# Patient Record
Sex: Female | Born: 1990 | Hispanic: No | Marital: Married | Smoking: Never smoker
Health system: Southern US, Community
[De-identification: ages and names within clinical notes are randomized; demographics above are authoritative.]

## PROBLEM LIST (undated history)

## (undated) DIAGNOSIS — E669 Obesity, unspecified: Secondary | ICD-10-CM

## (undated) DIAGNOSIS — Q513 Bicornate uterus: Secondary | ICD-10-CM

## (undated) DIAGNOSIS — F32A Depression, unspecified: Secondary | ICD-10-CM

## (undated) HISTORY — DX: Obesity, unspecified: E66.9

## (undated) HISTORY — PX: GALLBLADDER SURGERY: SHX652

## (undated) HISTORY — DX: Bicornate uterus: Q51.3

## (undated) HISTORY — DX: Morbid (severe) obesity due to excess calories: E66.01

## (undated) HISTORY — DX: Depression, unspecified: F32.A

---

## 2013-07-24 ENCOUNTER — Other Ambulatory Visit (HOSPITAL_COMMUNITY): Payer: Self-pay

## 2013-07-24 ENCOUNTER — Inpatient Hospital Stay (HOSPITAL_COMMUNITY): Admission: RE | Admit: 2013-07-24 | Payer: Self-pay | Source: Ambulatory Visit

## 2013-07-24 ENCOUNTER — Other Ambulatory Visit (HOSPITAL_COMMUNITY): Payer: Self-pay | Admitting: Obstetrics and Gynecology

## 2013-07-24 DIAGNOSIS — O3400 Maternal care for unspecified congenital malformation of uterus, unspecified trimester: Secondary | ICD-10-CM

## 2017-02-27 DIAGNOSIS — L2084 Intrinsic (allergic) eczema: Secondary | ICD-10-CM | POA: Diagnosis not present

## 2017-03-04 ENCOUNTER — Encounter: Payer: Self-pay | Admitting: Physician Assistant

## 2017-03-04 ENCOUNTER — Ambulatory Visit (INDEPENDENT_AMBULATORY_CARE_PROVIDER_SITE_OTHER): Payer: BLUE CROSS/BLUE SHIELD | Admitting: Physician Assistant

## 2017-03-04 VITALS — BP 107/69 | HR 72 | Ht 61.0 in | Wt 208.0 lb

## 2017-03-04 DIAGNOSIS — E6609 Other obesity due to excess calories: Secondary | ICD-10-CM

## 2017-03-04 DIAGNOSIS — Z6839 Body mass index (BMI) 39.0-39.9, adult: Secondary | ICD-10-CM

## 2017-03-04 DIAGNOSIS — Z7689 Persons encountering health services in other specified circumstances: Secondary | ICD-10-CM

## 2017-03-04 LAB — CBC
HCT: 42.1 % (ref 35.0–45.0)
Hemoglobin: 13.8 g/dL (ref 11.7–15.5)
MCH: 29 pg (ref 27.0–33.0)
MCHC: 32.8 g/dL (ref 32.0–36.0)
MCV: 88.4 fL (ref 80.0–100.0)
MPV: 9.7 fL (ref 7.5–12.5)
Platelets: 197 10*3/uL (ref 140–400)
RBC: 4.76 MIL/uL (ref 3.80–5.10)
RDW: 14.3 % (ref 11.0–15.0)
WBC: 9.5 10*3/uL (ref 3.8–10.8)

## 2017-03-04 NOTE — Patient Instructions (Addendum)
For weight: - It sounds like you are making healthy choices. Keep up the good work! - Try to limit calories to 1700 per day, including weekends. Download an app such as MyFitness Pal  - Try to add at least 3 sessions of physical activity per week. Consider getting a fitness tracker like a FitBit to monitor your steps and track your progress - Avoid alcohol. Women should limit alcohol to 1 drink per day, but if you want to lose weight, cutting out alcohol altogether is a great way to save calories - I have placed a referral for you to meet with a medical nutritionist - When you are no longer breastfeeding, the following may be medical options to consider  Qsymia  Contrave   Belviq  Saxenda  Go downstairs for labs. We will contact you within 48 hours with your lab results  Physical Activity Recommendations for modifying lipids and lowering blood pressure Engage in aerobic physical activity to reduce LDL-cholesterol, non-HDL-cholesterol, and blood pressure  Frequency: 3-4 sessions per week  Intensity: moderate to vigorous  Duration: 40 minutes on average  Physical Activity Recommendations for secondary prevention 1. Aerobic exercise  Frequency: 3-5 sessions per week  Intensity: 50-80% capacity  Duration: 20 - 60 minutes  Examples: walking, treadmill, cycling, rowing, stair climbing, and arm/leg ergometry  2. Resistance exercise  Frequency: 2-3 sessions per week  Intensity: 10-15 repetitions/set to moderate fatigue  Duration: 1-3 sets of 8-10 upper and lower body exercises  Examples: calisthenics, elastic bands, cuff/hand weights, dumbbels, free weights, wall pulleys, and weight machines  Heart-Healthy Lifestyle  Eating a diet rich in vegetables, fruits and whole grains: also includes low-fat dairy products, poultry, fish, legumes, and nuts; limit intake of sweets, sugar-sweetened beverages and red meats  Getting regular exercise  Maintaining a healthy weight  Not  smoking or getting help quitting  Staying on top of your health; for some people, lifestyle changes alone may not be enough to prevent a heart attack or stroke. In these cases, taking a statin at the right dose will most likely be necessary

## 2017-03-04 NOTE — Progress Notes (Signed)
HPI:                                                                Allison Oconnell is a 26 y.o. female who presents to [facility]: Primary Care Sports Medicine today to establish care   Current Concerns include thyroid and weight  Patient is requesting that her thyroid is checked today. She states she is having difficulty losing weight and is wondering if this is the cause. She also reports family history of thyroid disease in her mother and maternal grandmother. Patient is currently 4 months postpartum and breastfeeding. She reports a healthy pregnancy. She states she gained approximately 30 pounds in her most recent pregnancy. 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 She reports she actively diets during the weekdays, but not during the weekend. She is interested in weight loss medication.  Health Maintenance Health Maintenance  Topic Date Due  . HIV Screening  08/24/2005  . TETANUS/TDAP  08/24/2009  . PAP SMEAR  08/25/2011  . INFLUENZA VACCINE  05/15/2017    GYN/Sexual Health  Menstrual status: breastfeeding  Last pap smear: 03/30/16, NILM HPV negative  History of abnormal pap smears: no  Sexually active: yes, 1 female partner  Current contraception: mini-pill  Past Medical History:  Diagnosis Date  . Obesity    No past surgical history on file. Social History  Substance Use Topics  . Smoking status: Never Smoker  . Smokeless tobacco: Never Used  . Alcohol use Not on file   family history includes Melanoma in her maternal grandmother.  ROS: negative except as noted in the HPI  Medications: Current Outpatient Prescriptions  Medication Sig Dispense Refill  . norethindrone (MICRONOR,CAMILA,ERRIN) 0.35 MG tablet Take by mouth.     No current facility-administered medications for this visit.    Allergies  Allergen Reactions  . Sulfa Antibiotics Rash       Objective:  BP 107/69    Pulse 72   Ht 5\' 1"  (1.549 m)   Wt 208 lb (94.3 kg)   BMI 39.30 kg/m  Gen: well-groomed, cooperative, not ill-appearing, no distress HEENT: normal conjunctiva, TM's clear, oropharynx clear, moist mucus membranes, no thyromegaly or tenderness, neck supple, trachea midline Pulm: Normal work of breathing, normal phonation, clear to auscultation bilaterally CV: Normal rate, regular rhythm, s1 and s2 distinct, no murmurs, clicks or rubs, no carotid bruit GI: abdomen soft, nondistended, nontender, no masses Neuro: alert and oriented x 3, EOM's intact, PERRLA, DTR's intact, normal tone, no tremor MSK: moving all extremities, normal gait and station, no peripheral edema Skin: warm and dry, erythematous papular rash on bilaterals upper arms  Psych: normal affect, euthymic mood, normal speech and thought content   No results found for this or any previous visit (from the past 72 hour(s)). No results found.  Depression screen PHQ 2/9 03/04/2017  Decreased Interest 0  Down, Depressed, Hopeless 0  PHQ - 2 Score 0     Assessment and Plan: 26 y.o. female with   1. Encounter to establish care - declines Tdap  2. Class 2 obesity due to excess calories without serious comorbidity with body mass index (BMI) of 39.0 to 39.9 in adult -  CBC - Comprehensive metabolic panel - Lipid Panel w/reflex Direct LDL - VITAMIN D 25 Hydroxy (Vit-D Deficiency, Fractures) - TSH - Ambulatory referral to Family Practice to Dr. Dalbert GarnetBeasley for nutrition counseling - counseled patient that weight loss medications are not safe in breastfeeding, but to return when she is no longer breastfeeding to discuss options. She may be a good candidate for Qsymia or Belviq - limit calories to 1700/day - increase physical activity to 3 sessions per week - limit alcohol to 1 drink per day  Patient education and anticipatory guidance given Patient agrees with treatment plan Follow-up in 3 months or sooner as needed  Levonne Hubertharley E.  Waylyn Tenbrink PA-C

## 2017-03-05 LAB — COMPREHENSIVE METABOLIC PANEL
ALBUMIN: 4.6 g/dL (ref 3.6–5.1)
ALT: 22 U/L (ref 6–29)
AST: 21 U/L (ref 10–30)
Alkaline Phosphatase: 94 U/L (ref 33–115)
BUN: 12 mg/dL (ref 7–25)
CHLORIDE: 103 mmol/L (ref 98–110)
CO2: 26 mmol/L (ref 20–31)
Calcium: 9.6 mg/dL (ref 8.6–10.2)
Creat: 0.71 mg/dL (ref 0.50–1.10)
Glucose, Bld: 78 mg/dL (ref 65–99)
Potassium: 4.2 mmol/L (ref 3.5–5.3)
Sodium: 139 mmol/L (ref 135–146)
TOTAL PROTEIN: 7.3 g/dL (ref 6.1–8.1)
Total Bilirubin: 0.4 mg/dL (ref 0.2–1.2)

## 2017-03-05 LAB — LIPID PANEL W/REFLEX DIRECT LDL
CHOL/HDL RATIO: 3.9 ratio (ref <5.0)
CHOLESTEROL: 175 mg/dL (ref <200)
HDL: 45 mg/dL — ABNORMAL LOW (ref >50)
LDL-Cholesterol: 109 mg/dL — ABNORMAL HIGH
NON-HDL CHOLESTEROL (CALC): 130 mg/dL — AB (ref <130)
TRIGLYCERIDES: 106 mg/dL (ref <150)

## 2017-03-05 LAB — TSH: TSH: 1.55 mIU/L

## 2017-03-05 LAB — VITAMIN D 25 HYDROXY (VIT D DEFICIENCY, FRACTURES): Vit D, 25-Hydroxy: 33 ng/mL (ref 30–100)

## 2017-05-29 ENCOUNTER — Emergency Department (INDEPENDENT_AMBULATORY_CARE_PROVIDER_SITE_OTHER)
Admission: EM | Admit: 2017-05-29 | Discharge: 2017-05-29 | Disposition: A | Discharge status: Home / Self Care | Payer: BLUE CROSS/BLUE SHIELD | Source: Home / Self Care | Attending: Family Medicine | Admitting: Family Medicine

## 2017-05-29 ENCOUNTER — Encounter: Payer: Self-pay | Admitting: *Deleted

## 2017-05-29 ENCOUNTER — Emergency Department (INDEPENDENT_AMBULATORY_CARE_PROVIDER_SITE_OTHER): Payer: BLUE CROSS/BLUE SHIELD

## 2017-05-29 DIAGNOSIS — M79641 Pain in right hand: Secondary | ICD-10-CM

## 2017-05-29 DIAGNOSIS — M778 Other enthesopathies, not elsewhere classified: Secondary | ICD-10-CM | POA: Diagnosis not present

## 2017-05-29 DIAGNOSIS — M25531 Pain in right wrist: Secondary | ICD-10-CM

## 2017-05-29 DIAGNOSIS — M779 Enthesopathy, unspecified: Principal | ICD-10-CM

## 2017-05-29 MED ORDER — KETOROLAC TROMETHAMINE 60 MG/2ML IM SOLN
60.0000 mg | Freq: Once | INTRAMUSCULAR | Status: AC
  Administered 2017-05-29: 60 mg via INTRAMUSCULAR

## 2017-05-29 NOTE — Discharge Instructions (Signed)
°  You may take 500mg  acetaminophen (Tylenol) every 4-6 hours as needed for pain.  You may take up to 1000mg  (1g) of acetaminophen for the first dose.

## 2017-05-29 NOTE — ED Triage Notes (Signed)
Patient c/o right wrist pain x 1 month without known injury. Today she bumped her wrist on a lock in a cabinet. Pain shot up her arm. No w she has increased pain and weakness.

## 2017-05-29 NOTE — ED Provider Notes (Signed)
[facility]    CSN: [id number] Arrival date & time: 05/29/17  1612     History   Chief Complaint Chief Complaint  Patient presents with  . Wrist Pain    HPI Allison Oconnell is a 26 y.o. female.   HPI  Allison Oconnell is a 26 y.o. female presenting to UC with c/o 1 month of gradually worsening Right wrist and hand pain that has been aching and sore, worse with certain movements and grasping motions but pain had been tolerable.  Today, she accidentally bumped it on a lock in a cabinet causing sever pain to radiate up and down the radial aspect of her Right and hand.  She is unable to grip anything and has limited movement of hand and wrist due to pain.  Pain is aching and sharp, 6/10.  She did apply a warm and cool compress today, alternated it but has not been taking any tylenol or motrin. Pt states she is breastfeeding her 29mo old who is also eating some baby food but does not like taking medications.    Past Medical History:  Diagnosis Date  . Obesity     Patient Active Problem List   Diagnosis Date Noted  . Class 2 obesity due to excess calories without serious comorbidity with body mass index (BMI) of 39.0 to 39.9 in adult 03/04/2017    Past Surgical History:  Procedure Laterality Date  . GALLBLADDER SURGERY     may 2009    OB History    No data available       Home Medications    Prior to Admission medications   Medication Sig Start Date End Date Taking? Authorizing Provider  IRON PO Take by mouth.    [provider]  norethindrone (MICRONOR,CAMILA,ERRIN) 0.35 MG tablet Take by mouth. 12/31/16   [provider]    Family History Family History  Problem Relation Age of Onset  . Melanoma Maternal Grandmother   . Depression Mother   . Hypertension Father   . Stroke Father   . Lung cancer Paternal Grandfather     Social History Social History  Substance Use Topics  . Smoking status: Never Smoker  . Smokeless tobacco: Never Used   . Alcohol use Not on file     Allergies   Sulfa antibiotics   Review of Systems Review of Systems  Musculoskeletal: Positive for arthralgias and myalgias. Negative for joint swelling.  Skin: Positive for wound. Negative for color change.  Neurological: Positive for weakness. Negative for numbness.     Physical Exam Triage Vital Signs ED Triage Vitals  Enc Vitals Group     BP 05/29/17 1634 119/80     Pulse Rate 05/29/17 1634 (!) 104     Resp --      Temp --      Temp src --      SpO2 05/29/17 1634 99 %     Weight 05/29/17 1635 204 lb (92.5 kg)     Height --      Head Circumference --      Peak Flow --      Pain Score 05/29/17 1635 6     Pain Loc --      Pain Edu? --      Excl. in GC? --    No data found.   Updated Vital Signs BP 119/80 (BP Location: Left Arm)   Pulse (!) 104   Wt 204 lb (92.5 kg)   SpO2 99%  BMI 38.55 kg/m   Visual Acuity Right Eye Distance:   Left Eye Distance:   Bilateral Distance:    Right Eye Near:   Left Eye Near:    Bilateral Near:     Physical Exam  Constitutional: She is oriented to person, place, and time. She appears well-developed and well-nourished. No distress.  HENT:  Head: Normocephalic and atraumatic.  Eyes: EOM are normal.  Neck: Normal range of motion.  Cardiovascular: Normal rate.   Pulses:      Radial pulses are 2+ on the right side.  Pulmonary/Chest: Effort normal.  Musculoskeletal: She exhibits tenderness. She exhibits no edema.  Right wrist and hand: hesitant to move due to pain. Tenderness along radial aspect of wrist, 1st metacarpal and thumb. Slight decreased ROM of left hand, unable to make a full fist due to pain.  Neurological: She is alert and oriented to person, place, and time.  Skin: Skin is warm and dry. She is not diaphoretic.  Right wrist and hand: superficial abrasion to radial aspect of hand and wrist. No bleeding. No ecchymosis or edema.   Psychiatric: She has a normal mood and affect. Her  behavior is normal.  Nursing note and vitals reviewed.    UC Treatments / Results  Labs (all labs ordered are listed, but only abnormal results are displayed) Labs Reviewed - No data to display  EKG  EKG Interpretation None       Radiology Dg Wrist Complete Right  Result Date: 05/29/2017 CLINICAL DATA:  Wrist pain for 1 month, no known injury, initial encounter EXAM: RIGHT WRIST - COMPLETE 3+ VIEW COMPARISON:  None. FINDINGS: There is no evidence of fracture or dislocation. There is no evidence of arthropathy or other focal bone abnormality. Soft tissues are unremarkable. IMPRESSION: No acute abnormality noted. Electronically Signed   By: Alcide Clever M.D.   On: 05/29/2017 17:15   Dg Hand Complete Right  Result Date: 05/29/2017 CLINICAL DATA:  RIGHT hand and wrist pain for 1 month, no known injury, hit her hand this morning and pain worsened, radial hand and wrist pain EXAM: RIGHT HAND - COMPLETE 3+ VIEW COMPARISON:  None FINDINGS: Osseous mineralization normal. Joint spaces preserved. No acute fracture, dislocation, or bone destruction. IMPRESSION: No acute osseous abnormalities. Electronically Signed   By: Ulyses Southward M.D.   On: 05/29/2017 17:09    Procedures Procedures (including critical care time)  Medications Ordered in UC Medications  ketorolac (TORADOL) injection 60 mg (60 mg Intramuscular Given 05/29/17 1741)     Initial Impression / Assessment and Plan / UC Course  I have reviewed the triage vital signs and the nursing notes.  Pertinent labs & imaging results that were available during my care of the patient were reviewed by me and considered in my medical decision making (see chart for details).     Pt c/o worsening Right wrist and hand pain for 1 month, worse today after hitting it on a lock on a cabinet.   Hx and exam c/w tendonitis in Right hand w/o fracture or dislocation.  Final Clinical Impressions(s) / UC Diagnoses   Final diagnoses:  Right hand pain    Tendonitis of right hand  Acute pain of right wrist   Pt placed in thumb spica splint for comfort. Encouraged alternating acetaminophen and ibuprofen Pt agreeable with trying Toradol 60mg  IM in UC for pain. Encouraged f/u with Sports Medicine in 1-2 weeks if not improving, sooner if worsening.    New Prescriptions Discharge Medication  List as of 05/29/2017  5:21 PM       Controlled Substance Prescriptions [place] Controlled Substance Registry consulted? Not Applicable   Rolla Plate 05/29/17 1926

## 2017-11-18 ENCOUNTER — Ambulatory Visit: Payer: BLUE CROSS/BLUE SHIELD | Admitting: Physician Assistant

## 2019-02-25 DIAGNOSIS — M5137 Other intervertebral disc degeneration, lumbosacral region: Secondary | ICD-10-CM | POA: Diagnosis not present

## 2019-02-25 DIAGNOSIS — M9903 Segmental and somatic dysfunction of lumbar region: Secondary | ICD-10-CM | POA: Diagnosis not present

## 2019-02-25 DIAGNOSIS — M9902 Segmental and somatic dysfunction of thoracic region: Secondary | ICD-10-CM | POA: Diagnosis not present

## 2019-02-25 DIAGNOSIS — M546 Pain in thoracic spine: Secondary | ICD-10-CM | POA: Diagnosis not present

## 2019-02-26 DIAGNOSIS — M5137 Other intervertebral disc degeneration, lumbosacral region: Secondary | ICD-10-CM | POA: Diagnosis not present

## 2019-02-26 DIAGNOSIS — M546 Pain in thoracic spine: Secondary | ICD-10-CM | POA: Diagnosis not present

## 2019-02-26 DIAGNOSIS — M9902 Segmental and somatic dysfunction of thoracic region: Secondary | ICD-10-CM | POA: Diagnosis not present

## 2019-02-26 DIAGNOSIS — M9903 Segmental and somatic dysfunction of lumbar region: Secondary | ICD-10-CM | POA: Diagnosis not present

## 2019-03-04 DIAGNOSIS — M5137 Other intervertebral disc degeneration, lumbosacral region: Secondary | ICD-10-CM | POA: Diagnosis not present

## 2019-03-04 DIAGNOSIS — M546 Pain in thoracic spine: Secondary | ICD-10-CM | POA: Diagnosis not present

## 2019-03-04 DIAGNOSIS — M9903 Segmental and somatic dysfunction of lumbar region: Secondary | ICD-10-CM | POA: Diagnosis not present

## 2019-03-04 DIAGNOSIS — M9902 Segmental and somatic dysfunction of thoracic region: Secondary | ICD-10-CM | POA: Diagnosis not present

## 2019-03-11 DIAGNOSIS — M546 Pain in thoracic spine: Secondary | ICD-10-CM | POA: Diagnosis not present

## 2019-03-11 DIAGNOSIS — M9903 Segmental and somatic dysfunction of lumbar region: Secondary | ICD-10-CM | POA: Diagnosis not present

## 2019-03-11 DIAGNOSIS — M5137 Other intervertebral disc degeneration, lumbosacral region: Secondary | ICD-10-CM | POA: Diagnosis not present

## 2019-03-11 DIAGNOSIS — M9902 Segmental and somatic dysfunction of thoracic region: Secondary | ICD-10-CM | POA: Diagnosis not present

## 2019-03-18 DIAGNOSIS — M9902 Segmental and somatic dysfunction of thoracic region: Secondary | ICD-10-CM | POA: Diagnosis not present

## 2019-03-18 DIAGNOSIS — M5137 Other intervertebral disc degeneration, lumbosacral region: Secondary | ICD-10-CM | POA: Diagnosis not present

## 2019-03-18 DIAGNOSIS — M9903 Segmental and somatic dysfunction of lumbar region: Secondary | ICD-10-CM | POA: Diagnosis not present

## 2019-03-18 DIAGNOSIS — M546 Pain in thoracic spine: Secondary | ICD-10-CM | POA: Diagnosis not present

## 2019-03-25 DIAGNOSIS — M9902 Segmental and somatic dysfunction of thoracic region: Secondary | ICD-10-CM | POA: Diagnosis not present

## 2019-03-25 DIAGNOSIS — M546 Pain in thoracic spine: Secondary | ICD-10-CM | POA: Diagnosis not present

## 2019-03-25 DIAGNOSIS — M5137 Other intervertebral disc degeneration, lumbosacral region: Secondary | ICD-10-CM | POA: Diagnosis not present

## 2019-03-25 DIAGNOSIS — M9903 Segmental and somatic dysfunction of lumbar region: Secondary | ICD-10-CM | POA: Diagnosis not present

## 2019-04-01 DIAGNOSIS — M5137 Other intervertebral disc degeneration, lumbosacral region: Secondary | ICD-10-CM | POA: Diagnosis not present

## 2019-04-01 DIAGNOSIS — M9903 Segmental and somatic dysfunction of lumbar region: Secondary | ICD-10-CM | POA: Diagnosis not present

## 2019-04-01 DIAGNOSIS — M9902 Segmental and somatic dysfunction of thoracic region: Secondary | ICD-10-CM | POA: Diagnosis not present

## 2019-04-01 DIAGNOSIS — M546 Pain in thoracic spine: Secondary | ICD-10-CM | POA: Diagnosis not present

## 2019-04-15 DIAGNOSIS — M546 Pain in thoracic spine: Secondary | ICD-10-CM | POA: Diagnosis not present

## 2019-04-15 DIAGNOSIS — M9903 Segmental and somatic dysfunction of lumbar region: Secondary | ICD-10-CM | POA: Diagnosis not present

## 2019-04-15 DIAGNOSIS — M9902 Segmental and somatic dysfunction of thoracic region: Secondary | ICD-10-CM | POA: Diagnosis not present

## 2019-04-15 DIAGNOSIS — M5137 Other intervertebral disc degeneration, lumbosacral region: Secondary | ICD-10-CM | POA: Diagnosis not present

## 2019-04-22 DIAGNOSIS — M9903 Segmental and somatic dysfunction of lumbar region: Secondary | ICD-10-CM | POA: Diagnosis not present

## 2019-04-22 DIAGNOSIS — M9902 Segmental and somatic dysfunction of thoracic region: Secondary | ICD-10-CM | POA: Diagnosis not present

## 2019-04-22 DIAGNOSIS — M5137 Other intervertebral disc degeneration, lumbosacral region: Secondary | ICD-10-CM | POA: Diagnosis not present

## 2019-04-22 DIAGNOSIS — M546 Pain in thoracic spine: Secondary | ICD-10-CM | POA: Diagnosis not present

## 2019-04-29 DIAGNOSIS — M9903 Segmental and somatic dysfunction of lumbar region: Secondary | ICD-10-CM | POA: Diagnosis not present

## 2019-04-29 DIAGNOSIS — M9902 Segmental and somatic dysfunction of thoracic region: Secondary | ICD-10-CM | POA: Diagnosis not present

## 2019-04-29 DIAGNOSIS — M5137 Other intervertebral disc degeneration, lumbosacral region: Secondary | ICD-10-CM | POA: Diagnosis not present

## 2019-04-29 DIAGNOSIS — M546 Pain in thoracic spine: Secondary | ICD-10-CM | POA: Diagnosis not present

## 2019-05-06 DIAGNOSIS — O200 Threatened abortion: Secondary | ICD-10-CM | POA: Diagnosis not present

## 2019-05-06 DIAGNOSIS — O3680X0 Pregnancy with inconclusive fetal viability, not applicable or unspecified: Secondary | ICD-10-CM | POA: Diagnosis not present

## 2019-05-06 DIAGNOSIS — O360190 Maternal care for anti-D [Rh] antibodies, unspecified trimester, not applicable or unspecified: Secondary | ICD-10-CM | POA: Diagnosis not present

## 2019-05-06 DIAGNOSIS — Z3689 Encounter for other specified antenatal screening: Secondary | ICD-10-CM | POA: Diagnosis not present

## 2019-05-06 DIAGNOSIS — Z3A01 Less than 8 weeks gestation of pregnancy: Secondary | ICD-10-CM | POA: Diagnosis not present

## 2019-05-06 DIAGNOSIS — O209 Hemorrhage in early pregnancy, unspecified: Secondary | ICD-10-CM | POA: Diagnosis not present

## 2019-05-25 DIAGNOSIS — E669 Obesity, unspecified: Secondary | ICD-10-CM | POA: Diagnosis not present

## 2019-05-25 DIAGNOSIS — Z3689 Encounter for other specified antenatal screening: Secondary | ICD-10-CM | POA: Diagnosis not present

## 2019-05-25 DIAGNOSIS — O3680X0 Pregnancy with inconclusive fetal viability, not applicable or unspecified: Secondary | ICD-10-CM | POA: Diagnosis not present

## 2019-05-25 DIAGNOSIS — O99211 Obesity complicating pregnancy, first trimester: Secondary | ICD-10-CM | POA: Diagnosis not present

## 2019-07-23 DIAGNOSIS — Z23 Encounter for immunization: Secondary | ICD-10-CM | POA: Diagnosis not present

## 2019-09-23 DIAGNOSIS — M549 Dorsalgia, unspecified: Secondary | ICD-10-CM | POA: Diagnosis not present

## 2019-09-23 DIAGNOSIS — Z3689 Encounter for other specified antenatal screening: Secondary | ICD-10-CM | POA: Diagnosis not present

## 2019-09-23 DIAGNOSIS — Z3A26 26 weeks gestation of pregnancy: Secondary | ICD-10-CM | POA: Diagnosis not present

## 2019-09-23 DIAGNOSIS — O26892 Other specified pregnancy related conditions, second trimester: Secondary | ICD-10-CM | POA: Diagnosis not present

## 2019-10-01 DIAGNOSIS — O99213 Obesity complicating pregnancy, third trimester: Secondary | ICD-10-CM | POA: Diagnosis not present

## 2019-10-01 DIAGNOSIS — Z20828 Contact with and (suspected) exposure to other viral communicable diseases: Secondary | ICD-10-CM | POA: Diagnosis not present

## 2019-10-01 DIAGNOSIS — O43812 Placental infarction, second trimester: Secondary | ICD-10-CM | POA: Diagnosis not present

## 2019-10-01 DIAGNOSIS — O4393 Unspecified placental disorder, third trimester: Secondary | ICD-10-CM | POA: Diagnosis not present

## 2019-10-01 DIAGNOSIS — O368130 Decreased fetal movements, third trimester, not applicable or unspecified: Secondary | ICD-10-CM | POA: Diagnosis not present

## 2019-10-01 DIAGNOSIS — Z9049 Acquired absence of other specified parts of digestive tract: Secondary | ICD-10-CM | POA: Diagnosis not present

## 2019-10-01 DIAGNOSIS — Z3A27 27 weeks gestation of pregnancy: Secondary | ICD-10-CM | POA: Diagnosis not present

## 2019-10-01 DIAGNOSIS — Z3A26 26 weeks gestation of pregnancy: Secondary | ICD-10-CM | POA: Diagnosis not present

## 2019-10-01 DIAGNOSIS — O364XX0 Maternal care for intrauterine death, not applicable or unspecified: Secondary | ICD-10-CM | POA: Diagnosis not present

## 2019-10-01 DIAGNOSIS — O321XX0 Maternal care for breech presentation, not applicable or unspecified: Secondary | ICD-10-CM | POA: Diagnosis not present

## 2019-10-01 DIAGNOSIS — O368390 Maternal care for abnormalities of the fetal heart rate or rhythm, unspecified trimester, not applicable or unspecified: Secondary | ICD-10-CM | POA: Diagnosis not present

## 2019-10-05 DIAGNOSIS — Z3182 Encounter for Rh incompatibility status: Secondary | ICD-10-CM | POA: Diagnosis not present

## 2019-10-05 DIAGNOSIS — Z6791 Unspecified blood type, Rh negative: Secondary | ICD-10-CM | POA: Diagnosis not present

## 2019-11-04 DIAGNOSIS — R635 Abnormal weight gain: Secondary | ICD-10-CM | POA: Diagnosis not present

## 2019-11-20 ENCOUNTER — Other Ambulatory Visit: Payer: Self-pay

## 2019-11-20 ENCOUNTER — Encounter: Payer: Self-pay | Admitting: Family Medicine

## 2019-11-20 ENCOUNTER — Ambulatory Visit (INDEPENDENT_AMBULATORY_CARE_PROVIDER_SITE_OTHER): Payer: BC Managed Care – PPO | Admitting: Family Medicine

## 2019-11-20 DIAGNOSIS — Z6839 Body mass index (BMI) 39.0-39.9, adult: Secondary | ICD-10-CM

## 2019-11-20 DIAGNOSIS — E6609 Other obesity due to excess calories: Secondary | ICD-10-CM

## 2019-11-20 MED ORDER — PHENTERMINE HCL 37.5 MG PO CAPS: 37.5000 mg | ORAL_CAPSULE | ORAL | 1 refills | Status: DC

## 2019-11-20 NOTE — Assessment & Plan Note (Signed)
Discussed rx options for weight loss.  Discussed that without lifestyle changes she was unlikely to have long term success with any medications. I agree that she should be at a healthier weight prior to trying to get pregnant again.  She has tried phentermine briefly and tolerated well.  Will restart, discussed max duration I will prescribe this is 8-12 weeks.  F/u in 1 month.

## 2019-11-20 NOTE — Progress Notes (Signed)
Allison Oconnell - 29 y.o. female MRN [id number]  Date of birth: 1991-06-19  Subjective Chief Complaint  Patient presents with  . Weight Check    weight management. Wants something to help her loose weight    HPI [name] is a 29 y.o. female here today to discuss weight management.  She is interested in losing weight.  She was being followed by weight loss clinic through [facility] and prescribed phentermine however the cost was becoming too much as her insurance was not covering a lot of the appointments.   She was tolerating phentermine well without side effects and had lost about 4 lbs after the first week.  She is working on dietary changes and increasing exercise.  She did unfortunately have pregnancy ending with fetal demise last month.  She is hopeful to get pregnant again at towards the end of the year but would like to be a healthier weight before doing so.   ROS:  A comprehensive ROS was completed and negative except as noted per HPI  Allergies  Allergen Reactions  . Sulfa Antibiotics Rash    Past Medical History:  Diagnosis Date  . Obesity     Past Surgical History:  Procedure Laterality Date  . GALLBLADDER SURGERY     may 2009    Social History   Socioeconomic History  . Marital status: Married    Spouse name: Not on file  . Number of children: Not on file  . Years of education: Not on file  . Highest education level: Not on file  Occupational History  . Not on file  Tobacco Use  . Smoking status: Never Smoker  . Smokeless tobacco: Never Used  Substance and Sexual Activity  . Alcohol use: Not on file  . Drug use: Not on file  . Sexual activity: Yes    Birth control/protection: Pill  Other Topics Concern  . Not on file  Social History Narrative  . Not on file   Social Determinants of Health   Financial Resource Strain:   . Difficulty of Paying Living Expenses: Not on file  Food Insecurity:   . Worried About Programme researcher, broadcasting/film/video in the Last Year:  Not on file  . Ran Out of Food in the Last Year: Not on file  Transportation Needs:   . Lack of Transportation (Medical): Not on file  . Lack of Transportation (Non-Medical): Not on file  Physical Activity:   . Days of Exercise per Week: Not on file  . Minutes of Exercise per Session: Not on file  Stress:   . Feeling of Stress : Not on file  Social Connections:   . Frequency of Communication with Friends and Family: Not on file  . Frequency of Social Gatherings with Friends and Family: Not on file  . Attends Religious Services: Not on file  . Active Member of Clubs or Organizations: Not on file  . Attends Banker Meetings: Not on file  . Marital Status: Not on file    Family History  Problem Relation Age of Onset  . Melanoma Maternal Grandmother   . Depression Mother   . Hypertension Father   . Stroke Father   . Lung cancer Paternal Grandfather     Health Maintenance  Topic Date Due  . HIV Screening  08/24/2005  . TETANUS/TDAP  08/24/2009  . PAP-Cervical Cytology Screening  08/25/2011  . PAP SMEAR-Modifier  08/25/2011  . INFLUENZA VACCINE  05/16/2019    ----------------------------------------------------------------------------------------------------------------------------------------------------------------------------------------------------------------- Physical Exam  BP 120/86   Pulse 76   Wt 204 lb (92.5 kg)   BMI 38.55 kg/m   Physical Exam Constitutional:      Appearance: Normal appearance.  HENT:     Head: Normocephalic and atraumatic.  Eyes:     Pupils: Pupils are equal, round, and reactive to light.  Cardiovascular:     Rate and Rhythm: Normal rate and regular rhythm.  Pulmonary:     Effort: Pulmonary effort is normal.     Breath sounds: Normal breath sounds.  Musculoskeletal:     Cervical back: Neck supple.  Neurological:     General: No focal deficit present.     Mental Status: She is alert.  Psychiatric:        Mood and Affect:  Mood normal.        Behavior: Behavior normal.     ------------------------------------------------------------------------------------------------------------------------------------------------------------------------------------------------------------------- Assessment and Plan  Class 2 obesity due to excess calories without serious comorbidity with body mass index (BMI) of 39.0 to 39.9 in adult Discussed rx options for weight loss.  Discussed that without lifestyle changes she was unlikely to have long term success with any medications. I agree that she should be at a healthier weight prior to trying to get pregnant again.  She has tried phentermine briefly and tolerated well.  Will restart, discussed max duration I will prescribe this is 8-12 weeks.  F/u in 1 month.     This visit occurred during the SARS-CoV-2 public health emergency.  Safety protocols were in place, including screening questions prior to the visit, additional usage of staff PPE, and extensive cleaning of exam room while observing appropriate contact time as indicated for disinfecting solutions.

## 2019-11-20 NOTE — Patient Instructions (Signed)

## 2019-11-24 ENCOUNTER — Telehealth: Payer: Self-pay | Admitting: Family Medicine

## 2019-11-24 NOTE — Telephone Encounter (Signed)
Received fax for PA on Phentermine sent through cover my meds and received authorization.   Case ID: [id number] Valid: 10/25/19 - 02/22/20 - CF

## 2019-12-02 DIAGNOSIS — F419 Anxiety disorder, unspecified: Secondary | ICD-10-CM | POA: Diagnosis not present

## 2019-12-23 ENCOUNTER — Ambulatory Visit: Payer: BC Managed Care – PPO | Admitting: Family Medicine

## 2019-12-28 ENCOUNTER — Encounter: Payer: Self-pay | Admitting: Family Medicine

## 2019-12-28 ENCOUNTER — Ambulatory Visit (INDEPENDENT_AMBULATORY_CARE_PROVIDER_SITE_OTHER): Payer: BC Managed Care – PPO | Admitting: Family Medicine

## 2019-12-28 DIAGNOSIS — L239 Allergic contact dermatitis, unspecified cause: Secondary | ICD-10-CM

## 2019-12-28 DIAGNOSIS — E6609 Other obesity due to excess calories: Secondary | ICD-10-CM

## 2019-12-28 DIAGNOSIS — Z6839 Body mass index (BMI) 39.0-39.9, adult: Secondary | ICD-10-CM | POA: Diagnosis not present

## 2019-12-28 MED ORDER — TRIAMCINOLONE ACETONIDE 0.1 % EX CREA: 1.0000 "application " | TOPICAL_CREAM | Freq: Two times a day (BID) | CUTANEOUS | 0 refills | Status: DC

## 2019-12-28 NOTE — Patient Instructions (Signed)

## 2019-12-29 DIAGNOSIS — L259 Unspecified contact dermatitis, unspecified cause: Secondary | ICD-10-CM | POA: Insufficient documentation

## 2019-12-29 NOTE — Assessment & Plan Note (Signed)
Weight improved with phentermine . Weight loss to date is 8 lbs in 4 week period Will continue phentermine at current dose.  Continue to work on increasing activity and healthy food choices.  Return in about 2 months (around 02/27/2020) for Weight management.

## 2019-12-29 NOTE — Progress Notes (Signed)
Allison Oconnell - 29 y.o. female MRN [id number]  Date of birth: 29-Oct-1990  Subjective Chief Complaint  Patient presents with  . Weight Loss    HPI Allison Oconnell is a 29 y.o. female here today for follow up of weight management.  She was started in phentermine 37.5mg  at last visit 4 weeks ago.  She is tolerating this well and weight is down about 8lbs since last visit.  She has made changes to diet and is working on increasing exercise. She denies palpitations, shortness of breath, insomnia or increased anxiety at current dose.    She also has rash on bilateral arms and neck. Started about 1 week ago.  She was at a baby shower and opened a canister containing raffle tickets.  Unknown substance in container contacted skin and she noticed burning sensation at first with rash the following day.  She has tried moisturizer without much improvement.  Areas are itchy.  She denies pain or blistering.   ROS:  A comprehensive ROS was completed and negative except as noted per HPI  Allergies  Allergen Reactions  . Sulfa Antibiotics Rash    Past Medical History:  Diagnosis Date  . Obesity     Past Surgical History:  Procedure Laterality Date  . GALLBLADDER SURGERY     may 2009    Social History   Socioeconomic History  . Marital status: Married    Spouse name: Not on file  . Number of children: Not on file  . Years of education: Not on file  . Highest education level: Not on file  Occupational History  . Not on file  Tobacco Use  . Smoking status: Never Smoker  . Smokeless tobacco: Never Used  Substance and Sexual Activity  . Alcohol use: Not on file  . Drug use: Not on file  . Sexual activity: Yes    Birth control/protection: Pill  Other Topics Concern  . Not on file  Social History Narrative  . Not on file   Social Determinants of Health   Financial Resource Strain:   . Difficulty of Paying Living Expenses:   Food Insecurity:   . Worried About Charity fundraiser in the  Last Year:   . Arboriculturist in the Last Year:   Transportation Needs:   . Film/video editor (Medical):   Marland Kitchen Lack of Transportation (Non-Medical):   Physical Activity:   . Days of Exercise per Week:   . Minutes of Exercise per Session:   Stress:   . Feeling of Stress :   Social Connections:   . Frequency of Communication with Friends and Family:   . Frequency of Social Gatherings with Friends and Family:   . Attends Religious Services:   . Active Member of Clubs or Organizations:   . Attends Archivist Meetings:   Marland Kitchen Marital Status:     Family History  Problem Relation Age of Onset  . Melanoma Maternal Grandmother   . Depression Mother   . Hypertension Father   . Stroke Father   . Lung cancer Paternal Grandfather     Health Maintenance  Topic Date Due  . PAP-Cervical Cytology Screening  Never done  . PAP SMEAR-Modifier  Never done  . INFLUENZA VACCINE  01/13/2020 (Originally 05/16/2019)  . TETANUS/TDAP  12/22/2020 (Originally 08/24/2009)  . HIV Screening  12/22/2020 (Originally 08/24/2005)     ----------------------------------------------------------------------------------------------------------------------------------------------------------------------------------------------------------------- Physical Exam BP 109/74   Pulse 76   Temp (!) 97.4 F (36.3 C) (Oral)  Wt 196 lb (88.9 kg)   LMP 12/26/2019 (Exact Date)   SpO2 100% Comment: on RA  BMI 37.03 kg/m   Physical Exam Constitutional:      Appearance: Normal appearance.  HENT:     Head: Normocephalic and atraumatic.  Eyes:     General: No scleral icterus. Cardiovascular:     Rate and Rhythm: Normal rate and regular rhythm.  Pulmonary:     Effort: Pulmonary effort is normal.     Breath sounds: Normal breath sounds.  Musculoskeletal:     Cervical back: Neck supple.  Skin:    Comments: Papular rash on bilateral arms with some scaling around areas.  No blistering noted.    Neurological:     General: No focal deficit present.     Mental Status: She is alert.  Psychiatric:        Mood and Affect: Mood normal.        Behavior: Behavior normal.     ------------------------------------------------------------------------------------------------------------------------------------------------------------------------------------------------------------------- Assessment and Plan  Class 2 obesity due to excess calories without serious comorbidity with body mass index (BMI) of 39.0 to 39.9 in adult Weight improved with phentermine . Weight loss to date is 8 lbs in 4 week period Will continue phentermine at current dose.  Continue to work on increasing activity and healthy food choices.  Return in about 2 months (around 02/27/2020) for Weight management.   Contact dermatitis Rash with appearance of contact dermatitis.  Will start topical triamcinolone bid.  She will call if not improving.    Meds ordered this encounter  Medications  . triamcinolone cream (KENALOG) 0.1 %    Sig: Apply 1 application topically 2 (two) times daily.    Dispense:  30 g    Refill:  0    Return in about 2 months (around 02/27/2020) for Weight management.    This visit occurred during the SARS-CoV-2 public health emergency.  Safety protocols were in place, including screening questions prior to the visit, additional usage of staff PPE, and extensive cleaning of exam room while observing appropriate contact time as indicated for disinfecting solutions.

## 2019-12-29 NOTE — Assessment & Plan Note (Signed)
Rash with appearance of contact dermatitis.  Will start topical triamcinolone bid.  She will call if not improving.

## 2020-01-27 ENCOUNTER — Other Ambulatory Visit: Payer: Self-pay | Admitting: Family Medicine

## 2020-01-27 NOTE — Telephone Encounter (Signed)
I received a fax from the pharmacy requesting a refill of Phentermine. Per last note she is to follow up in May. Please advise.

## 2020-01-28 MED ORDER — PHENTERMINE HCL 37.5 MG PO CAPS: 37.5000 mg | ORAL_CAPSULE | ORAL | 0 refills | Status: DC

## 2020-04-29 DIAGNOSIS — O360110 Maternal care for anti-D [Rh] antibodies, first trimester, not applicable or unspecified: Secondary | ICD-10-CM | POA: Diagnosis not present

## 2020-04-29 DIAGNOSIS — Z3A00 Weeks of gestation of pregnancy not specified: Secondary | ICD-10-CM | POA: Diagnosis not present

## 2020-04-29 DIAGNOSIS — E669 Obesity, unspecified: Secondary | ICD-10-CM | POA: Diagnosis not present

## 2020-04-29 DIAGNOSIS — O99211 Obesity complicating pregnancy, first trimester: Secondary | ICD-10-CM | POA: Diagnosis not present

## 2020-04-29 DIAGNOSIS — O09291 Supervision of pregnancy with other poor reproductive or obstetric history, first trimester: Secondary | ICD-10-CM | POA: Diagnosis not present

## 2020-04-29 DIAGNOSIS — O209 Hemorrhage in early pregnancy, unspecified: Secondary | ICD-10-CM | POA: Diagnosis not present

## 2020-04-29 DIAGNOSIS — O200 Threatened abortion: Secondary | ICD-10-CM | POA: Diagnosis not present

## 2020-04-29 DIAGNOSIS — Z3201 Encounter for pregnancy test, result positive: Secondary | ICD-10-CM | POA: Diagnosis not present

## 2020-05-10 DIAGNOSIS — O200 Threatened abortion: Secondary | ICD-10-CM | POA: Diagnosis not present

## 2020-05-10 DIAGNOSIS — O09291 Supervision of pregnancy with other poor reproductive or obstetric history, first trimester: Secondary | ICD-10-CM | POA: Diagnosis not present

## 2020-05-10 DIAGNOSIS — O99211 Obesity complicating pregnancy, first trimester: Secondary | ICD-10-CM | POA: Diagnosis not present

## 2020-05-10 DIAGNOSIS — Z3A00 Weeks of gestation of pregnancy not specified: Secondary | ICD-10-CM | POA: Diagnosis not present

## 2020-05-10 DIAGNOSIS — O34591 Maternal care for other abnormalities of gravid uterus, first trimester: Secondary | ICD-10-CM | POA: Diagnosis not present

## 2020-05-10 DIAGNOSIS — O3680X0 Pregnancy with inconclusive fetal viability, not applicable or unspecified: Secondary | ICD-10-CM | POA: Diagnosis not present

## 2020-06-06 DIAGNOSIS — M545 Low back pain: Secondary | ICD-10-CM | POA: Diagnosis not present

## 2020-06-06 DIAGNOSIS — M9903 Segmental and somatic dysfunction of lumbar region: Secondary | ICD-10-CM | POA: Diagnosis not present

## 2020-06-06 DIAGNOSIS — G44219 Episodic tension-type headache, not intractable: Secondary | ICD-10-CM | POA: Diagnosis not present

## 2020-06-06 DIAGNOSIS — M9905 Segmental and somatic dysfunction of pelvic region: Secondary | ICD-10-CM | POA: Diagnosis not present

## 2020-06-08 DIAGNOSIS — M545 Low back pain: Secondary | ICD-10-CM | POA: Diagnosis not present

## 2020-06-08 DIAGNOSIS — G44219 Episodic tension-type headache, not intractable: Secondary | ICD-10-CM | POA: Diagnosis not present

## 2020-06-08 DIAGNOSIS — M9905 Segmental and somatic dysfunction of pelvic region: Secondary | ICD-10-CM | POA: Diagnosis not present

## 2020-06-08 DIAGNOSIS — M9903 Segmental and somatic dysfunction of lumbar region: Secondary | ICD-10-CM | POA: Diagnosis not present

## 2020-06-09 ENCOUNTER — Telehealth: Payer: Self-pay | Admitting: *Deleted

## 2020-06-09 ENCOUNTER — Encounter: Payer: Self-pay | Admitting: Obstetrics & Gynecology

## 2020-06-09 DIAGNOSIS — M545 Low back pain: Secondary | ICD-10-CM | POA: Diagnosis not present

## 2020-06-09 DIAGNOSIS — M9905 Segmental and somatic dysfunction of pelvic region: Secondary | ICD-10-CM | POA: Diagnosis not present

## 2020-06-09 DIAGNOSIS — M9903 Segmental and somatic dysfunction of lumbar region: Secondary | ICD-10-CM | POA: Diagnosis not present

## 2020-06-09 DIAGNOSIS — Z3490 Encounter for supervision of normal pregnancy, unspecified, unspecified trimester: Secondary | ICD-10-CM | POA: Insufficient documentation

## 2020-06-09 DIAGNOSIS — G44219 Episodic tension-type headache, not intractable: Secondary | ICD-10-CM | POA: Diagnosis not present

## 2020-06-09 NOTE — Telephone Encounter (Signed)
Returned call from Allison Oconnell, had left a message with Mariel Aloe, RN, that she thought that she has an appointment scheduled at [facility] office today. [name] is not and has never been a patient of [facility]. Office is not able to leave Ms.Dassow a voicemail message due to her mailbox being full.

## 2020-06-13 DIAGNOSIS — M9905 Segmental and somatic dysfunction of pelvic region: Secondary | ICD-10-CM | POA: Diagnosis not present

## 2020-06-13 DIAGNOSIS — M9903 Segmental and somatic dysfunction of lumbar region: Secondary | ICD-10-CM | POA: Diagnosis not present

## 2020-06-13 DIAGNOSIS — G44219 Episodic tension-type headache, not intractable: Secondary | ICD-10-CM | POA: Diagnosis not present

## 2020-06-13 DIAGNOSIS — M545 Low back pain: Secondary | ICD-10-CM | POA: Diagnosis not present

## 2020-06-14 DIAGNOSIS — M9903 Segmental and somatic dysfunction of lumbar region: Secondary | ICD-10-CM | POA: Diagnosis not present

## 2020-06-14 DIAGNOSIS — M9905 Segmental and somatic dysfunction of pelvic region: Secondary | ICD-10-CM | POA: Diagnosis not present

## 2020-06-14 DIAGNOSIS — M545 Low back pain: Secondary | ICD-10-CM | POA: Diagnosis not present

## 2020-06-14 DIAGNOSIS — G44219 Episodic tension-type headache, not intractable: Secondary | ICD-10-CM | POA: Diagnosis not present

## 2020-06-16 ENCOUNTER — Ambulatory Visit (INDEPENDENT_AMBULATORY_CARE_PROVIDER_SITE_OTHER): Payer: BC Managed Care – PPO | Admitting: Obstetrics & Gynecology

## 2020-06-16 ENCOUNTER — Encounter: Payer: Self-pay | Admitting: Obstetrics & Gynecology

## 2020-06-16 ENCOUNTER — Other Ambulatory Visit: Payer: Self-pay

## 2020-06-16 ENCOUNTER — Other Ambulatory Visit (HOSPITAL_COMMUNITY)
Admission: RE | Admit: 2020-06-16 | Discharge: 2020-06-16 | Disposition: A | Discharge status: Home / Self Care | Payer: BC Managed Care – PPO | Source: Ambulatory Visit | Attending: Obstetrics & Gynecology | Admitting: Obstetrics & Gynecology

## 2020-06-16 VITALS — BP 111/75 | HR 95 | Wt 193.0 lb

## 2020-06-16 DIAGNOSIS — Z3491 Encounter for supervision of normal pregnancy, unspecified, first trimester: Secondary | ICD-10-CM | POA: Insufficient documentation

## 2020-06-16 DIAGNOSIS — Z8759 Personal history of other complications of pregnancy, childbirth and the puerperium: Secondary | ICD-10-CM | POA: Insufficient documentation

## 2020-06-16 DIAGNOSIS — F32A Depression, unspecified: Secondary | ICD-10-CM | POA: Insufficient documentation

## 2020-06-16 DIAGNOSIS — M545 Low back pain: Secondary | ICD-10-CM | POA: Diagnosis not present

## 2020-06-16 DIAGNOSIS — Z6791 Unspecified blood type, Rh negative: Secondary | ICD-10-CM

## 2020-06-16 DIAGNOSIS — Z3490 Encounter for supervision of normal pregnancy, unspecified, unspecified trimester: Secondary | ICD-10-CM

## 2020-06-16 DIAGNOSIS — O99340 Other mental disorders complicating pregnancy, unspecified trimester: Secondary | ICD-10-CM

## 2020-06-16 DIAGNOSIS — Z3A12 12 weeks gestation of pregnancy: Secondary | ICD-10-CM

## 2020-06-16 DIAGNOSIS — F329 Major depressive disorder, single episode, unspecified: Secondary | ICD-10-CM

## 2020-06-16 DIAGNOSIS — Z6839 Body mass index (BMI) 39.0-39.9, adult: Secondary | ICD-10-CM

## 2020-06-16 DIAGNOSIS — E6609 Other obesity due to excess calories: Secondary | ICD-10-CM

## 2020-06-16 DIAGNOSIS — M9903 Segmental and somatic dysfunction of lumbar region: Secondary | ICD-10-CM | POA: Diagnosis not present

## 2020-06-16 DIAGNOSIS — M9905 Segmental and somatic dysfunction of pelvic region: Secondary | ICD-10-CM | POA: Diagnosis not present

## 2020-06-16 DIAGNOSIS — G44219 Episodic tension-type headache, not intractable: Secondary | ICD-10-CM | POA: Diagnosis not present

## 2020-06-16 DIAGNOSIS — O26899 Other specified pregnancy related conditions, unspecified trimester: Secondary | ICD-10-CM

## 2020-06-16 NOTE — Progress Notes (Signed)
Pt transfer OB care today from [facility].  She has had a previous Stillbirth due to coxsackie disease.  Today's U/S shows single IUP with CRL of 56.70 mm and GA [redacted]w[redacted]d.

## 2020-06-16 NOTE — Patient Instructions (Signed)
First Trimester of Pregnancy The first trimester of pregnancy is from week 1 until the end of week 13 (months 1 through 3). A week after a sperm fertilizes an egg, the egg will implant on the wall of the uterus. This embryo will begin to develop into a baby. Genes from you and your partner will form the baby. The female genes will determine whether the baby will be a boy or a girl. At 6-8 weeks, the eyes and face will be formed, and the heartbeat can be seen on ultrasound. At the end of 12 weeks, all the baby's organs will be formed. Now that you are pregnant, you will want to do everything you can to have a healthy baby. Two of the most important things are to get good prenatal care and to follow your health care provider's instructions. Prenatal care is all the medical care you receive before the baby's birth. This care will help prevent, find, and treat any problems during the pregnancy and childbirth. Body changes during your first trimester Your body goes through many changes during pregnancy. The changes vary from woman to woman.  You may gain or lose a couple of pounds at first.  You may feel sick to your stomach (nauseous) and you may throw up (vomit). If the vomiting is uncontrollable, call your health care provider.  You may tire easily.  You may develop headaches that can be relieved by medicines. All medicines should be approved by your health care provider.  You may urinate more often. Painful urination may mean you have a bladder infection.  You may develop heartburn as a result of your pregnancy.  You may develop constipation because certain hormones are causing the muscles that push stool through your intestines to slow down.  You may develop hemorrhoids or swollen veins (varicose veins).  Your breasts may begin to grow larger and become tender. Your nipples may stick out more, and the tissue that surrounds them (areola) may become darker.  Your gums may bleed and may be  sensitive to brushing and flossing.  Dark spots or blotches (chloasma, mask of pregnancy) may develop on your face. This will likely fade after the baby is born.  Your menstrual periods will stop.  You may have a loss of appetite.  You may develop cravings for certain kinds of food.  You may have changes in your emotions from day to day, such as being excited to be pregnant or being concerned that something may go wrong with the pregnancy and baby.  You may have more vivid and strange dreams.  You may have changes in your hair. These can include thickening of your hair, rapid growth, and changes in texture. Some women also have hair loss during or after pregnancy, or hair that feels dry or thin. Your hair will most likely return to normal after your baby is born. What to expect at prenatal visits During a routine prenatal visit:  You will be weighed to make sure you and the baby are growing normally.  Your blood pressure will be taken.  Your abdomen will be measured to track your baby's growth.  The fetal heartbeat will be listened to between weeks 10 and 14 of your pregnancy.  Test results from any previous visits will be discussed. Your health care provider may ask you:  How you are feeling.  If you are feeling the baby move.  If you have had any abnormal symptoms, such as leaking fluid, bleeding, severe headaches, or abdominal   cramping.  If you are using any tobacco products, including cigarettes, chewing tobacco, and electronic cigarettes.  If you have any questions. Other tests that may be performed during your first trimester include:  Blood tests to find your blood type and to check for the presence of any previous infections. The tests will also be used to check for low iron levels (anemia) and protein on red blood cells (Rh antibodies). Depending on your risk factors, or if you previously had diabetes during pregnancy, you may have tests to check for high blood sugar  that affects pregnant women (gestational diabetes).  Urine tests to check for infections, diabetes, or protein in the urine.  An ultrasound to confirm the proper growth and development of the baby.  Fetal screens for spinal cord problems (spina bifida) and Down syndrome.  HIV (human immunodeficiency virus) testing. Routine prenatal testing includes screening for HIV, unless you choose not to have this test.  You may need other tests to make sure you and the baby are doing well. Follow these instructions at home: Medicines  Follow your health care provider's instructions regarding medicine use. Specific medicines may be either safe or unsafe to take during pregnancy.  Take a prenatal vitamin that contains at least 600 micrograms (mcg) of folic acid.  If you develop constipation, try taking a stool softener if your health care provider approves. Eating and drinking   Eat a balanced diet that includes fresh fruits and vegetables, whole grains, good sources of protein such as meat, eggs, or tofu, and low-fat dairy. Your health care provider will help you determine the amount of weight gain that is right for you.  Avoid raw meat and uncooked cheese. These carry germs that can cause birth defects in the baby.  Eating four or five small meals rather than three large meals a day may help relieve nausea and vomiting. If you start to feel nauseous, eating a few soda crackers can be helpful. Drinking liquids between meals, instead of during meals, also seems to help ease nausea and vomiting.  Limit foods that are high in fat and processed sugars, such as fried and sweet foods.  To prevent constipation: ? Eat foods that are high in fiber, such as fresh fruits and vegetables, whole grains, and beans. ? Drink enough fluid to keep your urine clear or pale yellow. Activity  Exercise only as directed by your health care provider. Most women can continue their usual exercise routine during  pregnancy. Try to exercise for 30 minutes at least 5 days a week. Exercising will help you: ? Control your weight. ? Stay in shape. ? Be prepared for labor and delivery.  Experiencing pain or cramping in the lower abdomen or lower back is a good sign that you should stop exercising. Check with your health care provider before continuing with normal exercises.  Try to avoid standing for long periods of time. Move your legs often if you must stand in one place for a long time.  Avoid heavy lifting.  Wear low-heeled shoes and practice good posture.  You may continue to have sex unless your health care provider tells you not to. Relieving pain and discomfort  Wear a good support bra to relieve breast tenderness.  Take warm sitz baths to soothe any pain or discomfort caused by hemorrhoids. Use hemorrhoid cream if your health care provider approves.  Rest with your legs elevated if you have leg cramps or low back pain.  If you develop varicose veins in   your legs, wear support hose. Elevate your feet for 15 minutes, 3-4 times a day. Limit salt in your diet. Prenatal care  Schedule your prenatal visits by the twelfth week of pregnancy. They are usually scheduled monthly at first, then more often in the last 2 months before delivery.  Write down your questions. Take them to your prenatal visits.  Keep all your prenatal visits as told by your health care provider. This is important. Safety  Wear your seat belt at all times when driving.  Make a list of emergency phone numbers, including numbers for family, friends, the hospital, and police and fire departments. General instructions  Ask your health care provider for a referral to a local prenatal education class. Begin classes no later than the beginning of month 6 of your pregnancy.  Ask for help if you have counseling or nutritional needs during pregnancy. Your health care provider can offer advice or refer you to specialists for help  with various needs.  Do not use hot tubs, steam rooms, or saunas.  Do not douche or use tampons or scented sanitary pads.  Do not cross your legs for long periods of time.  Avoid cat litter boxes and soil used by cats. These carry germs that can cause birth defects in the baby and possibly loss of the fetus by miscarriage or stillbirth.  Avoid all smoking, herbs, alcohol, and medicines not prescribed by your health care provider. Chemicals in these products affect the formation and growth of the baby.  Do not use any products that contain nicotine or tobacco, such as cigarettes and e-cigarettes. If you need help quitting, ask your health care provider. You may receive counseling support and other resources to help you quit.  Schedule a dentist appointment. At home, brush your teeth with a soft toothbrush and be gentle when you floss. Contact a health care provider if:  You have dizziness.  You have mild pelvic cramps, pelvic pressure, or nagging pain in the abdominal area.  You have persistent nausea, vomiting, or diarrhea.  You have a bad smelling vaginal discharge.  You have pain when you urinate.  You notice increased swelling in your face, hands, legs, or ankles.  You are exposed to fifth disease or chickenpox.  You are exposed to German measles (rubella) and have never had it. Get help right away if:  You have a fever.  You are leaking fluid from your vagina.  You have spotting or bleeding from your vagina.  You have severe abdominal cramping or pain.  You have rapid weight gain or loss.  You vomit blood or material that looks like coffee grounds.  You develop a severe headache.  You have shortness of breath.  You have any kind of trauma, such as from a fall or a car accident. Summary  The first trimester of pregnancy is from week 1 until the end of week 13 (months 1 through 3).  Your body goes through many changes during pregnancy. The changes vary from  woman to woman.  You will have routine prenatal visits. During those visits, your health care provider will examine you, discuss any test results you may have, and talk with you about how you are feeling. This information is not intended to replace advice given to you by your health care provider. Make sure you discuss any questions you have with your health care provider. Document Revised: 09/13/2017 Document Reviewed: 09/12/2016 Elsevier Patient Education  2020 [employer] Inc[employer]  

## 2020-06-16 NOTE — Progress Notes (Signed)
  Subjective:    Kacia Halley is being seen today for her first obstetrical visit. [id number]. She is s/p IUFD at 28 weeks last year. She is at [redacted]w[redacted]d gestation. Her obstetrical history is significant for IUFD at 28 weeks. Placental cx grew Coxsackie virus. . Relationship with FOB: spouse, living together. Pt has a h/o PP depression. She is currently on Zoloft. She had 2 PNV prior to this appy but, her OB has retired and she didn't want to cont at the group that took over his pt. She also feels anxious going to the prev hospital where she has a loss. Pregnancy history fully reviewed.  Patient reports no complaints.  Review of Systems:   Review of Systems  Objective:     BP 111/75   Pulse 95   Wt 193 lb (87.5 kg)   LMP 03/24/2020   BMI 36.47 kg/m  Physical Exam  Exam General Appearance:    Alert, cooperative, no distress, appears stated age  Head:    Normocephalic, without obvious abnormality, atraumatic  Eyes:    conjunctiva/corneas clear, EOM's intact, both eyes  Ears:    Normal external ear canals, both ears  Nose:   Nares normal, septum midline, mucosa normal, no drainage    or sinus tenderness  Throat:   Lips, mucosa, and tongue normal; teeth and gums normal  Neck:   Supple, symmetrical, trachea midline, no adenopathy;    thyroid:  no enlargement/tenderness/nodules  Back:     Symmetric, no curvature, ROM normal, no CVA tenderness  Lungs:     Clear to auscultation bilaterally, respirations unlabored  Chest Wall:    No tenderness or deformity   Heart:    Regular rate and rhythm, S1 and S2 normal, no murmur, rub   or gallop  Breast Exam:   Not done.   Abdomen:     Soft, non-tender, bowel sounds active all four quadrants,    no masses, no organomegaly  Genitalia:    Normal female without lesion, discharge or tenderness     Extremities:   Extremities normal, atraumatic, no cyanosis or edema  Pulses:   2+ and symmetric all extremities  Skin:   Skin color, texture, turgor normal, no  rashes or lesions     Assessment:    Pregnancy: [id number] Patient Active Problem List   Diagnosis Date Noted  . History of IUFD 06/16/2020  . Rh negative state in antepartum period 06/16/2020  . Depression affecting pregnancy, antepartum 06/16/2020  . Supervision of normal pregnancy 06/09/2020  . Contact dermatitis 12/29/2019  . Class 2 obesity due to excess calories without serious comorbidity with body mass index (BMI) of 39.0 to 39.9 in adult 03/04/2017       Plan:     Initial labs drawn. Prenatal vitamins. Problem list reviewed and updated. AFP3 discussed: requested. Role of ultrasound in pregnancy discussed; fetal survey: requested. Amniocentesis discussed: not indicated. Follow up in 4 weeks. 60% of 35 min visit spent on counseling and coordination of care.   Willodean Rosenthal 06/16/2020

## 2020-06-17 LAB — CYTOLOGY - PAP
Chlamydia: NEGATIVE
Comment: NEGATIVE
Comment: NORMAL
Diagnosis: NEGATIVE
Neisseria Gonorrhea: NEGATIVE

## 2020-06-18 LAB — URINE CULTURE, OB REFLEX

## 2020-06-18 LAB — CULTURE, OB URINE

## 2020-06-21 DIAGNOSIS — M9903 Segmental and somatic dysfunction of lumbar region: Secondary | ICD-10-CM | POA: Diagnosis not present

## 2020-06-21 DIAGNOSIS — M545 Low back pain: Secondary | ICD-10-CM | POA: Diagnosis not present

## 2020-06-21 DIAGNOSIS — M9905 Segmental and somatic dysfunction of pelvic region: Secondary | ICD-10-CM | POA: Diagnosis not present

## 2020-06-21 DIAGNOSIS — G44219 Episodic tension-type headache, not intractable: Secondary | ICD-10-CM | POA: Diagnosis not present

## 2020-06-23 DIAGNOSIS — M545 Low back pain: Secondary | ICD-10-CM | POA: Diagnosis not present

## 2020-06-23 DIAGNOSIS — G44219 Episodic tension-type headache, not intractable: Secondary | ICD-10-CM | POA: Diagnosis not present

## 2020-06-23 DIAGNOSIS — M9905 Segmental and somatic dysfunction of pelvic region: Secondary | ICD-10-CM | POA: Diagnosis not present

## 2020-06-23 DIAGNOSIS — M9903 Segmental and somatic dysfunction of lumbar region: Secondary | ICD-10-CM | POA: Diagnosis not present

## 2020-07-04 DIAGNOSIS — M9903 Segmental and somatic dysfunction of lumbar region: Secondary | ICD-10-CM | POA: Diagnosis not present

## 2020-07-04 DIAGNOSIS — M9905 Segmental and somatic dysfunction of pelvic region: Secondary | ICD-10-CM | POA: Diagnosis not present

## 2020-07-04 DIAGNOSIS — G44219 Episodic tension-type headache, not intractable: Secondary | ICD-10-CM | POA: Diagnosis not present

## 2020-07-04 DIAGNOSIS — M545 Low back pain: Secondary | ICD-10-CM | POA: Diagnosis not present

## 2020-07-11 DIAGNOSIS — M9905 Segmental and somatic dysfunction of pelvic region: Secondary | ICD-10-CM | POA: Diagnosis not present

## 2020-07-11 DIAGNOSIS — M9903 Segmental and somatic dysfunction of lumbar region: Secondary | ICD-10-CM | POA: Diagnosis not present

## 2020-07-11 DIAGNOSIS — G44219 Episodic tension-type headache, not intractable: Secondary | ICD-10-CM | POA: Diagnosis not present

## 2020-07-11 DIAGNOSIS — M545 Low back pain: Secondary | ICD-10-CM | POA: Diagnosis not present

## 2020-07-13 DIAGNOSIS — G44219 Episodic tension-type headache, not intractable: Secondary | ICD-10-CM | POA: Diagnosis not present

## 2020-07-13 DIAGNOSIS — M9905 Segmental and somatic dysfunction of pelvic region: Secondary | ICD-10-CM | POA: Diagnosis not present

## 2020-07-13 DIAGNOSIS — M9901 Segmental and somatic dysfunction of cervical region: Secondary | ICD-10-CM | POA: Diagnosis not present

## 2020-07-13 DIAGNOSIS — M9903 Segmental and somatic dysfunction of lumbar region: Secondary | ICD-10-CM | POA: Diagnosis not present

## 2020-07-14 ENCOUNTER — Encounter: Payer: Self-pay | Admitting: Obstetrics and Gynecology

## 2020-07-14 ENCOUNTER — Other Ambulatory Visit: Payer: Self-pay

## 2020-07-14 ENCOUNTER — Ambulatory Visit (INDEPENDENT_AMBULATORY_CARE_PROVIDER_SITE_OTHER): Payer: BC Managed Care – PPO | Admitting: Obstetrics and Gynecology

## 2020-07-14 VITALS — BP 100/57 | HR 83 | Wt 201.0 lb

## 2020-07-14 DIAGNOSIS — Z8759 Personal history of other complications of pregnancy, childbirth and the puerperium: Secondary | ICD-10-CM

## 2020-07-14 DIAGNOSIS — F329 Major depressive disorder, single episode, unspecified: Secondary | ICD-10-CM

## 2020-07-14 DIAGNOSIS — O99340 Other mental disorders complicating pregnancy, unspecified trimester: Secondary | ICD-10-CM

## 2020-07-14 DIAGNOSIS — Z3490 Encounter for supervision of normal pregnancy, unspecified, unspecified trimester: Secondary | ICD-10-CM

## 2020-07-14 DIAGNOSIS — O26899 Other specified pregnancy related conditions, unspecified trimester: Secondary | ICD-10-CM

## 2020-07-14 DIAGNOSIS — F32A Depression, unspecified: Secondary | ICD-10-CM

## 2020-07-14 DIAGNOSIS — Z6791 Unspecified blood type, Rh negative: Secondary | ICD-10-CM

## 2020-07-14 DIAGNOSIS — E6609 Other obesity due to excess calories: Secondary | ICD-10-CM

## 2020-07-14 DIAGNOSIS — Z6839 Body mass index (BMI) 39.0-39.9, adult: Secondary | ICD-10-CM

## 2020-07-14 DIAGNOSIS — Z3A16 16 weeks gestation of pregnancy: Secondary | ICD-10-CM

## 2020-07-14 MED ORDER — ASPIRIN EC 81 MG PO TBEC: 81.0000 mg | DELAYED_RELEASE_TABLET | Freq: Every day | ORAL | 2 refills | Status: DC

## 2020-07-14 NOTE — Progress Notes (Signed)
   PRENATAL VISIT NOTE  Subjective:  Allison Oconnell is a 29 y.o. G5P2102 at [redacted]w[redacted]d being seen today for ongoing prenatal care.  She is currently monitored for the following issues for this high-risk pregnancy and has Class 2 obesity due to excess calories without serious comorbidity with body mass index (BMI) of 39.0 to 39.9 in adult; Contact dermatitis; Supervision of normal pregnancy; History of IUFD; Rh negative state in antepartum period; and Depression affecting pregnancy, antepartum on their problem list.  Patient reports no complaints.  Contractions: Not present. Vag. Bleeding: None.  Movement: Present. Denies leaking of fluid.   The following portions of the patient's history were reviewed and updated as appropriate: allergies, current medications, past family history, past medical history, past social history, past surgical history and problem list.   Objective:   Vitals:   07/14/20 0938  BP: (!) 100/57  Pulse: 83  Weight: 201 lb (91.2 kg)    Fetal Status: Fetal Heart Rate (bpm): 145   Movement: Present     General:  Alert, oriented and cooperative. Patient is in no acute distress.  Skin: Skin is warm and dry. No rash noted.   Cardiovascular: Normal heart rate noted  Respiratory: Normal respiratory effort, no problems with respiration noted  Abdomen: Soft, gravid, appropriate for gestational age.  Pain/Pressure: Absent     Pelvic: Cervical exam deferred        Extremities: Normal range of motion.  Edema: None  Mental Status: Normal mood and affect. Normal behavior. Normal judgment and thought content.   Assessment and Plan:  Pregnancy: G5P2102 at [redacted]w[redacted]d  1. [redacted] weeks gestation of pregnancy  2. Encounter for supervision of normal pregnancy, antepartum, unspecified gravidity Needs initial labs drawn, cannot stay today to have done - declines genetic testing, AFP  3. History of IUFD  4. Rh negative state in antepartum period Rho gam 28 weeks  5. Class 2 obesity due to  excess calories without serious comorbidity with body mass index (BMI) of 39.0 to 39.9 in adult Start baby aspirin  6. Depression affecting pregnancy, antepartum Stable on zoloft   Preterm labor symptoms and general obstetric precautions including but not limited to vaginal bleeding, contractions, leaking of fluid and fetal movement were reviewed in detail with the patient. Please refer to After Visit Summary for other counseling recommendations.   Return in about 4 weeks (around 08/11/2020) for low OB, in person.  Future Appointments  Date Time Provider Department Center  08/12/2020  8:30 AM Rolm Bookbinder, CNM [facility]    Conan Bowens, MD

## 2020-07-14 NOTE — Progress Notes (Signed)
Pt declined AFP 

## 2020-07-18 ENCOUNTER — Ambulatory Visit (INDEPENDENT_AMBULATORY_CARE_PROVIDER_SITE_OTHER): Payer: BC Managed Care – PPO

## 2020-07-18 ENCOUNTER — Other Ambulatory Visit: Payer: Self-pay

## 2020-07-18 DIAGNOSIS — Z3A16 16 weeks gestation of pregnancy: Secondary | ICD-10-CM | POA: Diagnosis not present

## 2020-07-18 DIAGNOSIS — M9901 Segmental and somatic dysfunction of cervical region: Secondary | ICD-10-CM | POA: Diagnosis not present

## 2020-07-18 DIAGNOSIS — M9903 Segmental and somatic dysfunction of lumbar region: Secondary | ICD-10-CM | POA: Diagnosis not present

## 2020-07-18 DIAGNOSIS — Z3490 Encounter for supervision of normal pregnancy, unspecified, unspecified trimester: Secondary | ICD-10-CM | POA: Diagnosis not present

## 2020-07-18 DIAGNOSIS — G44219 Episodic tension-type headache, not intractable: Secondary | ICD-10-CM | POA: Diagnosis not present

## 2020-07-18 DIAGNOSIS — M9905 Segmental and somatic dysfunction of pelvic region: Secondary | ICD-10-CM | POA: Diagnosis not present

## 2020-07-18 NOTE — Progress Notes (Signed)
Pt sent to lab for blood draw

## 2020-07-19 DIAGNOSIS — Z3A16 16 weeks gestation of pregnancy: Secondary | ICD-10-CM | POA: Diagnosis not present

## 2020-07-20 LAB — HEPATITIS C ANTIBODY
Hepatitis C Ab: NONREACTIVE
SIGNAL TO CUT-OFF: 0.01 (ref <1.00)

## 2020-07-20 LAB — HIV ANTIBODY (ROUTINE TESTING W REFLEX): HIV 1&2 Ab, 4th Generation: NONREACTIVE

## 2020-07-21 DIAGNOSIS — M9901 Segmental and somatic dysfunction of cervical region: Secondary | ICD-10-CM | POA: Diagnosis not present

## 2020-07-21 DIAGNOSIS — M9905 Segmental and somatic dysfunction of pelvic region: Secondary | ICD-10-CM | POA: Diagnosis not present

## 2020-07-21 DIAGNOSIS — G44219 Episodic tension-type headache, not intractable: Secondary | ICD-10-CM | POA: Diagnosis not present

## 2020-07-21 DIAGNOSIS — M9903 Segmental and somatic dysfunction of lumbar region: Secondary | ICD-10-CM | POA: Diagnosis not present

## 2020-07-21 LAB — OBSTETRIC PANEL
Hepatitis B Surface Ag: NONREACTIVE
RPR Ser Ql: NONREACTIVE
Rubella: 4.67 Index

## 2020-07-21 LAB — HEMOGLOBINOPATHY EVALUATION

## 2020-07-22 DIAGNOSIS — Z20822 Contact with and (suspected) exposure to covid-19: Secondary | ICD-10-CM | POA: Diagnosis not present

## 2020-08-01 ENCOUNTER — Encounter: Payer: Self-pay | Admitting: *Deleted

## 2020-08-01 DIAGNOSIS — Z3A19 19 weeks gestation of pregnancy: Secondary | ICD-10-CM | POA: Diagnosis not present

## 2020-08-01 DIAGNOSIS — Z20822 Contact with and (suspected) exposure to covid-19: Secondary | ICD-10-CM | POA: Diagnosis not present

## 2020-08-04 ENCOUNTER — Ambulatory Visit: Payer: BC Managed Care – PPO

## 2020-08-17 ENCOUNTER — Ambulatory Visit: Payer: BC Managed Care – PPO | Attending: Obstetrics & Gynecology

## 2020-08-17 ENCOUNTER — Other Ambulatory Visit: Payer: Self-pay

## 2020-08-17 ENCOUNTER — Other Ambulatory Visit: Payer: Self-pay | Admitting: *Deleted

## 2020-08-17 DIAGNOSIS — Z3490 Encounter for supervision of normal pregnancy, unspecified, unspecified trimester: Secondary | ICD-10-CM | POA: Insufficient documentation

## 2020-08-17 DIAGNOSIS — Z3A12 12 weeks gestation of pregnancy: Secondary | ICD-10-CM | POA: Diagnosis not present

## 2020-08-17 DIAGNOSIS — Z8759 Personal history of other complications of pregnancy, childbirth and the puerperium: Secondary | ICD-10-CM

## 2020-08-18 DIAGNOSIS — G44219 Episodic tension-type headache, not intractable: Secondary | ICD-10-CM | POA: Diagnosis not present

## 2020-08-18 DIAGNOSIS — M9901 Segmental and somatic dysfunction of cervical region: Secondary | ICD-10-CM | POA: Diagnosis not present

## 2020-08-18 DIAGNOSIS — M9905 Segmental and somatic dysfunction of pelvic region: Secondary | ICD-10-CM | POA: Diagnosis not present

## 2020-08-18 DIAGNOSIS — M9903 Segmental and somatic dysfunction of lumbar region: Secondary | ICD-10-CM | POA: Diagnosis not present

## 2020-08-22 DIAGNOSIS — M9903 Segmental and somatic dysfunction of lumbar region: Secondary | ICD-10-CM | POA: Diagnosis not present

## 2020-08-22 DIAGNOSIS — M9905 Segmental and somatic dysfunction of pelvic region: Secondary | ICD-10-CM | POA: Diagnosis not present

## 2020-08-22 DIAGNOSIS — G44219 Episodic tension-type headache, not intractable: Secondary | ICD-10-CM | POA: Diagnosis not present

## 2020-08-22 DIAGNOSIS — M9901 Segmental and somatic dysfunction of cervical region: Secondary | ICD-10-CM | POA: Diagnosis not present

## 2020-08-23 ENCOUNTER — Ambulatory Visit (INDEPENDENT_AMBULATORY_CARE_PROVIDER_SITE_OTHER): Payer: BC Managed Care – PPO | Admitting: Advanced Practice Midwife

## 2020-08-23 ENCOUNTER — Other Ambulatory Visit: Payer: Self-pay

## 2020-08-23 VITALS — BP 107/73 | HR 114 | Wt 207.0 lb

## 2020-08-23 DIAGNOSIS — Z3490 Encounter for supervision of normal pregnancy, unspecified, unspecified trimester: Secondary | ICD-10-CM

## 2020-08-23 DIAGNOSIS — Z3A21 21 weeks gestation of pregnancy: Secondary | ICD-10-CM

## 2020-08-23 DIAGNOSIS — Z8759 Personal history of other complications of pregnancy, childbirth and the puerperium: Secondary | ICD-10-CM

## 2020-08-23 NOTE — Progress Notes (Signed)
   PRENATAL VISIT NOTE  Subjective:  Allison Oconnell is a 29 y.o. G5P2102 at [redacted]w[redacted]d being seen today for ongoing prenatal care.  She is currently monitored for the following issues for this high-risk pregnancy and has Class 2 obesity due to excess calories without serious comorbidity with body mass index (BMI) of 39.0 to 39.9 in adult; Contact dermatitis; Supervision of normal pregnancy; History of IUFD; Rh negative state in antepartum period; and Depression affecting pregnancy, antepartum on their problem list.  Patient reports no complaints.  Contractions: Not present. Vag. Bleeding: None.  Movement: Present. Denies leaking of fluid.   The following portions of the patient's history were reviewed and updated as appropriate: allergies, current medications, past family history, past medical history, past social history, past surgical history and problem list.   Objective:   Vitals:   08/23/20 0944  BP: 107/73  Pulse: (!) 114  Weight: 207 lb (93.9 kg)    Fetal Status: Fetal Heart Rate (bpm): 134   Movement: Present     General:  Alert, oriented and cooperative. Patient is in no acute distress.  Skin: Skin is warm and dry. No rash noted.   Cardiovascular: Normal heart rate noted  Respiratory: Normal respiratory effort, no problems with respiration noted  Abdomen: Soft, gravid, appropriate for gestational age.  Pain/Pressure: Present     Pelvic: Cervical exam deferred        Extremities: Normal range of motion.  Edema: None  Mental Status: Normal mood and affect. Normal behavior. Normal judgment and thought content.   Assessment and Plan:  Pregnancy: G5P2102 at [redacted]w[redacted]d 1. [redacted] weeks gestation of pregnancy   2. History of IUFD --Last year, due to Coxsackie disease per placental cx  3. Encounter for supervision of normal pregnancy, antepartum, unspecified gravidity --Anticipatory guidance about next visits/weeks of pregnancy given. --Discussed pt experience at [facility] with her loss last  year.  She and her husband were not happy with the care at the hospital. She has a good relationship with her MD, who came in to deliver her IUFD and came in the next morning to discuss his findings with the placenta. But she hopes for better nursing care and overall experience with this delivery. --She desires freedom of movement so discussed wireless monitors, hydrotherapy in the shower, exercise ball, walking in the room and the hallway as options during labor. She desires to labor and even to push in hands and knees and standing positions since she has chronic back pain after previous deliveries.  She does not desire epidural, but is open to the idea if needed. --Questions answered, shared decision-making always a part of care with [place].   --Lab results missing CBC, ABO RN and antibody screen (lavender tube not collected or not run?) so redrawn today --Next appt in 4 weeks in office, then in 6 weeks for GTT  Preterm labor symptoms and general obstetric precautions including but not limited to vaginal bleeding, contractions, leaking of fluid and fetal movement were reviewed in detail with the patient. Please refer to After Visit Summary for other counseling recommendations.   No follow-ups on file.  Future Appointments  Date Time Provider Department Center  09/14/2020  9:15 AM [facility] NURSE [facility]  09/14/2020  9:30 AM [facility] US3 [facility]  09/20/2020  8:30 AM Leftwich-Kirby, Wilmer Floor, CNM [facility]  10/04/2020  8:50 AM Rasch, Harolyn Rutherford, NP [facility]    Sharen Counter, CNM

## 2020-08-24 LAB — CBC
HCT: 37.5 % (ref 35.0–45.0)
Hemoglobin: 12.6 g/dL (ref 11.7–15.5)
MCH: 31.4 pg (ref 27.0–33.0)
MCHC: 33.6 g/dL (ref 32.0–36.0)
MCV: 93.5 fL (ref 80.0–100.0)
MPV: 10.9 fL (ref 7.5–12.5)
Platelets: 174 10*3/uL (ref 140–400)
RBC: 4.01 10*6/uL (ref 3.80–5.10)
RDW: 12.8 % (ref 11.0–15.0)
WBC: 10.2 10*3/uL (ref 3.8–10.8)

## 2020-08-24 LAB — ABO AND RH

## 2020-08-24 LAB — EXTRA SPECIMEN

## 2020-08-24 LAB — ANTIBODY SCREEN: Antibody Screen: NOT DETECTED

## 2020-08-25 DIAGNOSIS — G44219 Episodic tension-type headache, not intractable: Secondary | ICD-10-CM | POA: Diagnosis not present

## 2020-08-25 DIAGNOSIS — M9905 Segmental and somatic dysfunction of pelvic region: Secondary | ICD-10-CM | POA: Diagnosis not present

## 2020-08-25 DIAGNOSIS — M9903 Segmental and somatic dysfunction of lumbar region: Secondary | ICD-10-CM | POA: Diagnosis not present

## 2020-08-25 DIAGNOSIS — M9901 Segmental and somatic dysfunction of cervical region: Secondary | ICD-10-CM | POA: Diagnosis not present

## 2020-08-29 DIAGNOSIS — M9901 Segmental and somatic dysfunction of cervical region: Secondary | ICD-10-CM | POA: Diagnosis not present

## 2020-08-29 DIAGNOSIS — M9905 Segmental and somatic dysfunction of pelvic region: Secondary | ICD-10-CM | POA: Diagnosis not present

## 2020-08-29 DIAGNOSIS — M9903 Segmental and somatic dysfunction of lumbar region: Secondary | ICD-10-CM | POA: Diagnosis not present

## 2020-08-29 DIAGNOSIS — G44219 Episodic tension-type headache, not intractable: Secondary | ICD-10-CM | POA: Diagnosis not present

## 2020-09-01 DIAGNOSIS — M9903 Segmental and somatic dysfunction of lumbar region: Secondary | ICD-10-CM | POA: Diagnosis not present

## 2020-09-01 DIAGNOSIS — M9901 Segmental and somatic dysfunction of cervical region: Secondary | ICD-10-CM | POA: Diagnosis not present

## 2020-09-01 DIAGNOSIS — M9905 Segmental and somatic dysfunction of pelvic region: Secondary | ICD-10-CM | POA: Diagnosis not present

## 2020-09-01 DIAGNOSIS — G44219 Episodic tension-type headache, not intractable: Secondary | ICD-10-CM | POA: Diagnosis not present

## 2020-09-14 ENCOUNTER — Ambulatory Visit: Payer: BC Managed Care – PPO

## 2020-09-14 ENCOUNTER — Other Ambulatory Visit: Payer: Self-pay

## 2020-09-15 DIAGNOSIS — M9905 Segmental and somatic dysfunction of pelvic region: Secondary | ICD-10-CM | POA: Diagnosis not present

## 2020-09-15 DIAGNOSIS — M9903 Segmental and somatic dysfunction of lumbar region: Secondary | ICD-10-CM | POA: Diagnosis not present

## 2020-09-15 DIAGNOSIS — M9901 Segmental and somatic dysfunction of cervical region: Secondary | ICD-10-CM | POA: Diagnosis not present

## 2020-09-15 DIAGNOSIS — G44219 Episodic tension-type headache, not intractable: Secondary | ICD-10-CM | POA: Diagnosis not present

## 2020-09-20 ENCOUNTER — Ambulatory Visit (INDEPENDENT_AMBULATORY_CARE_PROVIDER_SITE_OTHER): Payer: BC Managed Care – PPO | Admitting: Advanced Practice Midwife

## 2020-09-20 ENCOUNTER — Other Ambulatory Visit: Payer: Self-pay

## 2020-09-20 VITALS — BP 97/68 | HR 94 | Wt 209.0 lb

## 2020-09-20 DIAGNOSIS — Z3A25 25 weeks gestation of pregnancy: Secondary | ICD-10-CM

## 2020-09-20 DIAGNOSIS — O26892 Other specified pregnancy related conditions, second trimester: Secondary | ICD-10-CM | POA: Insufficient documentation

## 2020-09-20 DIAGNOSIS — Z3490 Encounter for supervision of normal pregnancy, unspecified, unspecified trimester: Secondary | ICD-10-CM

## 2020-09-20 DIAGNOSIS — R102 Pelvic and perineal pain: Secondary | ICD-10-CM | POA: Insufficient documentation

## 2020-09-20 DIAGNOSIS — Z8759 Personal history of other complications of pregnancy, childbirth and the puerperium: Secondary | ICD-10-CM

## 2020-09-20 MED ORDER — COMFORT FIT MATERNITY SUPP MED MISC: 1.0000 | Freq: Every day | 0 refills | Status: DC

## 2020-09-20 NOTE — Progress Notes (Signed)
   PRENATAL VISIT NOTE  Subjective:  Allison Oconnell is a 29 y.o. W2X9371 at [redacted]w[redacted]d being seen today for ongoing prenatal care.  She is currently monitored for the following issues for this high-risk pregnancy and has Class 2 obesity due to excess calories without serious comorbidity with body mass index (BMI) of 39.0 to 39.9 in adult; Contact dermatitis; Supervision of normal pregnancy; History of IUFD; Rh negative state in antepartum period; and Depression affecting pregnancy, antepartum on their problem list.  Patient reports no complaints.  Contractions: Not present. Vag. Bleeding: None.  Movement: Present. Denies leaking of fluid.   The following portions of the patient's history were reviewed and updated as appropriate: allergies, current medications, past family history, past medical history, past social history, past surgical history and problem list.   Objective:   Vitals:   09/20/20 0831  BP: 97/68  Pulse: 94  Weight: 209 lb (94.8 kg)    Fetal Status: Fetal Heart Rate (bpm): 138   Movement: Present     General:  Alert, oriented and cooperative. Patient is in no acute distress.  Skin: Skin is warm and dry. No rash noted.   Cardiovascular: Normal heart rate noted  Respiratory: Normal respiratory effort, no problems with respiration noted  Abdomen: Soft, gravid, appropriate for gestational age.  Pain/Pressure: Present     Pelvic: Cervical exam deferred        Extremities: Normal range of motion.  Edema: None  Mental Status: Normal mood and affect. Normal behavior. Normal judgment and thought content.   Assessment and Plan:  Pregnancy: G5P2102 at [redacted]w[redacted]d 1. [redacted] weeks gestation of pregnancy   2. Pelvic pain affecting pregnancy in second trimester, antepartum --Rest/ice/heat/warm bath/Tylenol/pregnancy support belt  - Elastic Bandages & Supports (COMFORT FIT MATERNITY SUPP MED) MISC; 1 Device by Does not apply route daily.  Dispense: 1 each; Refill: 0 - Ambulatory referral to  Physical Therapy  3. Encounter for supervision of normal pregnancy, antepartum, unspecified gravidity --Anticipatory guidance about next visits/weeks of pregnancy given. --Next visit on 12/21 for 28 week labs/rhogam  4. History of IUFD --In 2020 due to Coxsackie disease  Preterm labor symptoms and general obstetric precautions including but not limited to vaginal bleeding, contractions, leaking of fluid and fetal movement were reviewed in detail with the patient. Please refer to After Visit Summary for other counseling recommendations.   No follow-ups on file.  Future Appointments  Date Time Provider Department Center  09/23/2020 10:45 AM [facility] NURSE [facility]  09/23/2020 11:00 AM [facility] US1 [facility]  10/04/2020  8:50 AM Rasch, Harolyn Rutherford, NP [facility]    Sharen Counter, CNM

## 2020-09-23 ENCOUNTER — Other Ambulatory Visit: Payer: Self-pay

## 2020-09-23 ENCOUNTER — Encounter: Payer: Self-pay | Admitting: *Deleted

## 2020-09-23 ENCOUNTER — Ambulatory Visit: Payer: BC Managed Care – PPO | Admitting: *Deleted

## 2020-09-23 ENCOUNTER — Ambulatory Visit: Payer: BC Managed Care – PPO | Attending: Obstetrics and Gynecology

## 2020-09-23 DIAGNOSIS — O09292 Supervision of pregnancy with other poor reproductive or obstetric history, second trimester: Secondary | ICD-10-CM

## 2020-09-23 DIAGNOSIS — O99340 Other mental disorders complicating pregnancy, unspecified trimester: Secondary | ICD-10-CM

## 2020-09-23 DIAGNOSIS — O26899 Other specified pregnancy related conditions, unspecified trimester: Secondary | ICD-10-CM

## 2020-09-23 DIAGNOSIS — Z8759 Personal history of other complications of pregnancy, childbirth and the puerperium: Secondary | ICD-10-CM | POA: Insufficient documentation

## 2020-09-23 DIAGNOSIS — Z3A26 26 weeks gestation of pregnancy: Secondary | ICD-10-CM

## 2020-09-23 DIAGNOSIS — Z362 Encounter for other antenatal screening follow-up: Secondary | ICD-10-CM | POA: Diagnosis not present

## 2020-09-23 DIAGNOSIS — O321XX0 Maternal care for breech presentation, not applicable or unspecified: Secondary | ICD-10-CM | POA: Diagnosis not present

## 2020-09-23 DIAGNOSIS — Q513 Bicornate uterus: Secondary | ICD-10-CM

## 2020-09-23 DIAGNOSIS — Z6791 Unspecified blood type, Rh negative: Secondary | ICD-10-CM | POA: Insufficient documentation

## 2020-09-23 DIAGNOSIS — O34593 Maternal care for other abnormalities of gravid uterus, third trimester: Secondary | ICD-10-CM

## 2020-09-23 DIAGNOSIS — F32A Depression, unspecified: Secondary | ICD-10-CM | POA: Insufficient documentation

## 2020-09-29 ENCOUNTER — Ambulatory Visit (INDEPENDENT_AMBULATORY_CARE_PROVIDER_SITE_OTHER): Payer: BC Managed Care – PPO | Admitting: Obstetrics & Gynecology

## 2020-09-29 ENCOUNTER — Other Ambulatory Visit: Payer: Self-pay

## 2020-09-29 VITALS — BP 104/64 | HR 96 | Temp 98.0°F | Wt 211.0 lb

## 2020-09-29 DIAGNOSIS — O26892 Other specified pregnancy related conditions, second trimester: Secondary | ICD-10-CM | POA: Diagnosis not present

## 2020-09-29 DIAGNOSIS — O26899 Other specified pregnancy related conditions, unspecified trimester: Secondary | ICD-10-CM

## 2020-09-29 DIAGNOSIS — Z6791 Unspecified blood type, Rh negative: Secondary | ICD-10-CM

## 2020-09-29 DIAGNOSIS — Z3A27 27 weeks gestation of pregnancy: Secondary | ICD-10-CM | POA: Diagnosis not present

## 2020-09-29 DIAGNOSIS — S3991XA Unspecified injury of abdomen, initial encounter: Secondary | ICD-10-CM

## 2020-09-29 DIAGNOSIS — Z8759 Personal history of other complications of pregnancy, childbirth and the puerperium: Secondary | ICD-10-CM

## 2020-09-29 NOTE — Progress Notes (Signed)
   PRENATAL VISIT NOTE  Subjective:  Allison Oconnell is a 29 y.o. O2D7412 at [redacted]w[redacted]d being seen today for ongoing prenatal care.  She is currently monitored for the following issues for this high-risk pregnancy and has Class 2 obesity due to excess calories without serious comorbidity with body mass index (BMI) of 39.0 to 39.9 in adult; Contact dermatitis; Supervision of normal pregnancy; History of IUFD; Rh negative state in antepartum period; Depression affecting pregnancy, antepartum; and Pelvic pain affecting pregnancy in second trimester, antepartum on their problem list.   Pt's son ran into her abdomen last night and she is very sore today.  She thinks she may have contracted some last night.  Denies fluid or vaginal bleeding.  This is the same time in pregnancy as her IUFD and she is very nervous and anxious today.  Patient reports abdominal soreness.  Contractions: Not present. Vag. Bleeding: None.  Movement: Present. Denies leaking of fluid.   The following portions of the patient's history were reviewed and updated as appropriate: allergies, current medications, past family history, past medical history, past social history, past surgical history and problem list.   Objective:   Vitals:   09/29/20 0916  BP: 104/64  Pulse: 96  Temp: 98 F (36.7 C)  Weight: 211 lb (95.7 kg)    Fetal Status: Fetal Heart Rate (bpm): 127   Movement: Present     General:  Alert, oriented and cooperative. Patient is in no acute distress.  Skin: Skin is warm and dry. No rash noted.   Cardiovascular: Normal heart rate noted  Respiratory: Normal respiratory effort, no problems with respiration noted  Abdomen: Soft, gravid, appropriate for gestational age.  Pain/Pressure: Present     Pelvic: Cervical exam deferred        Extremities: Normal range of motion.  Edema: None  Mental Status: Normal mood and affect. Normal behavior. Normal judgment and thought content.   Assessment and Plan:  Pregnancy: G5P2102  at [redacted]w[redacted]d 1. [redacted] weeks gestation of pregnancy - Has appt next week and will have 28 week labs done  2. Abdominal trauma, initial encounter -NST obtained today.  3. History of IUFD -Had growth scan 09/23/2020.  Declines additional growth scans  4. Rh negative state in antepartum period -Will need Rhogam and antibody screen with 28 week labs   Term labor symptoms and general obstetric precautions including but not limited to vaginal bleeding, contractions, leaking of fluid and fetal movement were reviewed in detail with the patient. Please refer to After Visit Summary for other counseling recommendations.    Future Appointments  Date Time Provider Department Center  10/04/2020  8:50 AM Rasch, Harolyn Rutherford, NP [facility]    Jerene Bears, MD

## 2020-10-04 ENCOUNTER — Ambulatory Visit (INDEPENDENT_AMBULATORY_CARE_PROVIDER_SITE_OTHER): Payer: BC Managed Care – PPO | Admitting: Obstetrics and Gynecology

## 2020-10-04 ENCOUNTER — Other Ambulatory Visit: Payer: Self-pay

## 2020-10-04 VITALS — BP 103/62 | HR 86 | Wt 215.0 lb

## 2020-10-04 DIAGNOSIS — Z3A27 27 weeks gestation of pregnancy: Secondary | ICD-10-CM | POA: Diagnosis not present

## 2020-10-04 DIAGNOSIS — Z3483 Encounter for supervision of other normal pregnancy, third trimester: Secondary | ICD-10-CM

## 2020-10-04 DIAGNOSIS — Z23 Encounter for immunization: Secondary | ICD-10-CM

## 2020-10-04 DIAGNOSIS — Z3490 Encounter for supervision of normal pregnancy, unspecified, unspecified trimester: Secondary | ICD-10-CM | POA: Diagnosis not present

## 2020-10-04 MED ORDER — RHO D IMMUNE GLOBULIN 1500 UNIT/2ML IJ SOSY
300.0000 ug | PREFILLED_SYRINGE | Freq: Once | INTRAMUSCULAR | Status: AC
  Administered 2020-10-04: 10:00:00 300 ug via INTRAMUSCULAR

## 2020-10-04 NOTE — Progress Notes (Signed)
   PRENATAL VISIT NOTE  Subjective:  Allison Oconnell is a 29 y.o. C1K4818 at [redacted]w[redacted]d being seen today for ongoing prenatal care.  She is currently monitored for the following issues for this low-risk pregnancy and has Class 2 obesity due to excess calories without serious comorbidity with body mass index (BMI) of 39.0 to 39.9 in adult; Contact dermatitis; Supervision of normal pregnancy; History of IUFD; Rh negative state in antepartum period; Depression affecting pregnancy, antepartum; and Pelvic pain affecting pregnancy in second trimester, antepartum on their problem list.  Patient reports no complaints.  Contractions: Not present. Vag. Bleeding: None.  Movement: Present. Denies leaking of fluid.   The following portions of the patient's history were reviewed and updated as appropriate: allergies, current medications, past family history, past medical history, past social history, past surgical history and problem list.   Objective:   Vitals:   10/04/20 0857  BP: 103/62  Pulse: 86  Weight: 215 lb (97.5 kg)    Fetal Status: Fetal Heart Rate (bpm): 132 Fundal Height: 28 cm Movement: Present     General:  Alert, oriented and cooperative. Patient is in no acute distress.  Skin: Skin is warm and dry. No rash noted.   Cardiovascular: Normal heart rate noted  Respiratory: Normal respiratory effort, no problems with respiration noted  Abdomen: Soft, gravid, appropriate for gestational age.  Pain/Pressure: Present     Pelvic: Cervical exam deferred        Extremities: Normal range of motion.  Edema: None  Mental Status: Normal mood and affect. Normal behavior. Normal judgment and thought content.   Assessment and Plan:  Pregnancy: G5P2102 at [redacted]w[redacted]d 1. [redacted] weeks gestation of pregnancy  - 2Hr GTT w/ 1 Hr Carpenter 75 g - Antibody screen - HIV antibody (with reflex) - CBC - RPR - Rhogam and TDAP given today.  Preterm labor symptoms and general obstetric precautions including but not limited  to vaginal bleeding, contractions, leaking of fluid and fetal movement were reviewed in detail with the patient. Please refer to After Visit Summary for other counseling recommendations.   Return in about 2 weeks (around 10/18/2020).  Future Appointments  Date Time Provider Department Center  10/18/2020  8:50 AM Leftwich-Kirby, Wilmer Floor, CNM [facility]    Venia Carbon, NP

## 2020-10-05 LAB — RPR: RPR Ser Ql: NONREACTIVE

## 2020-10-05 LAB — CBC
HCT: 37.5 % (ref 35.0–45.0)
Hemoglobin: 12.9 g/dL (ref 11.7–15.5)
MCH: 31.5 pg (ref 27.0–33.0)
MCHC: 34.4 g/dL (ref 32.0–36.0)
MCV: 91.7 fL (ref 80.0–100.0)
MPV: 10.9 fL (ref 7.5–12.5)
Platelets: 160 10*3/uL (ref 140–400)
RBC: 4.09 10*6/uL (ref 3.80–5.10)
RDW: 12.5 % (ref 11.0–15.0)
WBC: 9.7 10*3/uL (ref 3.8–10.8)

## 2020-10-05 LAB — 2HR GTT W 1 HR, CARPENTER, 75 G
Glucose, 1 Hr, Gest: 76 mg/dL (ref 65–179)
Glucose, 2 Hr, Gest: 62 mg/dL — ABNORMAL LOW (ref 65–152)
Glucose, Fasting, Gest: 71 mg/dL (ref 65–91)

## 2020-10-05 LAB — ANTIBODY SCREEN: Antibody Screen: NOT DETECTED

## 2020-10-05 LAB — HIV ANTIBODY (ROUTINE TESTING W REFLEX): HIV 1&2 Ab, 4th Generation: NONREACTIVE

## 2020-10-11 ENCOUNTER — Encounter: Payer: Self-pay | Admitting: Physical Therapy

## 2020-10-11 ENCOUNTER — Other Ambulatory Visit: Payer: Self-pay

## 2020-10-11 ENCOUNTER — Encounter: Payer: BC Managed Care – PPO | Attending: Advanced Practice Midwife | Admitting: Physical Therapy

## 2020-10-11 DIAGNOSIS — R278 Other lack of coordination: Secondary | ICD-10-CM | POA: Diagnosis not present

## 2020-10-11 DIAGNOSIS — R252 Cramp and spasm: Secondary | ICD-10-CM | POA: Diagnosis not present

## 2020-10-11 DIAGNOSIS — M6281 Muscle weakness (generalized): Secondary | ICD-10-CM | POA: Diagnosis not present

## 2020-10-11 NOTE — Patient Instructions (Signed)
Access Code: [email or web address] URL: [email or web address] Date: 10/11/2020 Prepared by: Eulis Foster  Program Notes use roller ball on inner thighs use a rolled up towel in the low back if lay on back and sitting   Exercises Sidelying Diaphragmatic Breathing - 1 x daily - 7 x weekly - 1 sets - 10 reps Seated Diaphragmatic Breathing - 1 x daily - 7 x weekly - 1 sets - 10 reps Supine Diaphragmatic Breathing - 1 x daily - 7 x weekly - 1 sets - 10 reps Eulis Foster, PT [facility] [address] [place], [place] [address] W: [phone] [email or web address]

## 2020-10-11 NOTE — Therapy (Signed)
[facility] [address], [address] [place], [place], [address] Phone: [phone]   Fax:  [phone]  Physical Therapy Evaluation  Patient Details  Name: Allison Oconnell MRN: [id number] Date of Birth: [birth year] Referring Provider (PT): Sharen Counter,  CNM   Encounter Date: 10/11/2020   PT End of Session - 10/11/20 1150    Visit Number 1    Date for PT Re-Evaluation 01/03/21    Authorization Type BCBS    PT Start Time 0930    PT Stop Time 1025    PT Time Calculation (min) 55 min    Activity Tolerance Patient tolerated treatment well;No increased pain    Behavior During Therapy [facility] for tasks assessed/performed           Past Medical History:  Diagnosis Date   Bicornuate uterus    Depression    Obesity     Past Surgical History:  Procedure Laterality Date   GALLBLADDER SURGERY     may 2009    There were no vitals filed for this visit.    Subjective Assessment - 10/11/20 0944    Subjective Patient reports her pain got worse with son who os 44 years old. Patient is seeing a Land.  The pain comes and goes due to activity. Feels like someone has punched her in the pelvis. Will walk with a limp.    Patient Stated Goals reduce pain    Currently in Pain? Yes    Pain Score 7    wirset [place] 10/10   Pain Location Pelvis   anterior groin   Pain Orientation Right;Left;Mid    Pain Descriptors / Indicators Sore;Pins and needles;Tender    Pain Type Chronic pain    Pain Onset More than a month ago    Pain Frequency Constant    Aggravating Factors  cleaning the bathoom; alot of walking, bending over, intercourse afterwards but not during intercourse.    Pain Relieving Factors stretch, lay down, limit activity; pregnancy pillow at night    Multiple Pain Sites No              [facility] PT Assessment - 10/11/20 0001      Assessment   Medical Diagnosis O26.892, R10.2 Pelvicpain affecting pregnancy in second  trimester, antepartum    Referring Provider (PT) Sharen Counter,  CNM    Onset Date/Surgical Date --   constant and stared 4 years ago   Prior Therapy none      Precautions   Precautions Other (comment)    Precaution Comments pregnant      Restrictions   Weight Bearing Restrictions No      Balance Screen   Has the patient fallen in the past 6 months Yes    How many times? 1    Has the patient had a decrease in activity level because of a fear of falling?  No   foot got caugh in the door   Is the patient reluctant to leave their home because of a fear of falling?  No      Home Tourist information centre manager residence      Prior Function   Level of Independence Independent    Leisure hard to exercise due to back      Cognition   Overall Cognitive Status Within Functional Limits for tasks assessed      Posture/Postural Control   Posture/Postural Control Postural limitations    Postural Limitations Increased lumbar lordosis  Posture Comments pregnant posture      ROM / Strength   AROM / PROM / Strength AROM;PROM;Strength      AROM   Lumbar Flexion decreased by 25% with tightness in the lumbar    Lumbar - Right Side Bend decreased by 25%      Strength   Right Hip ABduction 4-/5    Right Hip ADduction 5/5    Left Hip ABduction 5/5    Left Hip ADduction 4/5      Palpation   SI assessment  right ilium anteriorly rotated    Palpation comment tenderness in righ tpsoas, right superior inguinal ligament, bil. hip adductors, levator ani                      Objective measurements completed on examination: See above findings.     Pelvic Floor Special Questions - 10/11/20 0001    Are you Pregnant or attempting pregnancy? Yes   due date 12/29/20, boy   Currently Sexually Active Yes    Marinoff Scale discomfort that does not affect completion   pain the next day   Urinary Leakage Yes    Activities that cause leaking Coughing;Other    Other  activities that cause leaking after eating, walking    Fecal incontinence No            [facility] Adult PT Treatment/Exercise - 10/11/20 0001      Self-Care   Self-Care Other Self-Care Comments    Other Self-Care Comments  using a roller ball on her hip adductors to relax the muscles.      Therapeutic Activites    Therapeutic Activities ADL's    ADL's rolling in bed like a log and breathing out to reduce the strain on the pelvic floor      Lumbar Exercises: Supine   Other Supine Lumbar Exercises diaphragmatic breathing to expaind the lower rib cage and relax her pelvic floor                  PT Education - 10/11/20 1031    Education Details Access Code: [email or web address]    Person(s) Educated Patient    Methods Explanation;Demonstration;Verbal cues;Handout    Comprehension Returned demonstration;Verbalized understanding            PT Short Term Goals - 10/11/20 1143      PT SHORT TERM GOAL #1   Title independent with initial HEP    Time 4    Period Weeks    Status New    Target Date 11/08/20      PT SHORT TERM GOAL #2   Title understand how to use pillows to support her back, and abdomen to reduce strain on the pelvis    Time 4    Period Weeks    Status New    Target Date 11/08/20      PT SHORT TERM GOAL #3   Title able to perfrom pelvic drop to elongate the pelvic floor    Time 4    Period Weeks    Status New    Target Date 11/08/20      PT SHORT TERM GOAL #4   Title understand ways to move in bed and during daily activities to reduce strain on the pelvis    Time 4    Period Weeks    Status New    Target Date 11/08/20             PT Long Term  Goals - 10/11/20 1145      PT LONG TERM GOAL #1   Title independent with advanced HEP and how to perform perineal massage to reduce chance of tearing    Time 12    Period Weeks    Status New    Target Date 01/03/21      PT LONG TERM GOAL #2   Title able to have penile penetration with pain decreased >/= 50%  afterwards    Time 12    Period Weeks    Status New    Target Date 01/03/21      PT LONG TERM GOAL #3   Title pain with daily activities decreased </= 4/10 due to improved elongation of the pelvic muscles and understanding ways to manage her pain.    Time 12    Period Weeks    Status New    Target Date 01/03/21      PT LONG TERM GOAL #4   Title understand ways to give birth with different positions to reduce the pelvic pain and transition the baby into the canal    Time 12    Period Weeks    Status New    Target Date 01/03/21                  Plan - 10/11/20 1127    Clinical Impression Statement Patient has had chronic pelvic pain for the past 4 years since she had her son. Patient reports a constant pain at level 7/10 but can get to ta 10/10. Patietn reports her pain is worse the next day after intercourse, bending over, cleaning, and walking. Patient will stretch to reduce her pain. Her right ilium is anteriorly rotated. Patient has tightness in the lumbar spine. Her lumbar flexion and right sidebending decreased by 25%. Patient reports chronic back issues. Palpable tenderness located in the right psoas, superior right inguinal ligament, bilateral levator ani , bilateral hip adductorsand obturator internist. Patient has tightness in the quadricep, hamstring, hip adductors, lumbar paraspinals. Patient has decreased rib motion and will breath with increased thoracic lumbar junction. Patient stands with increased lumbar lordosis. Patient reports urinaty leakage with coughing and walking. Patient is due on 12/29/2020. Patient will benefit from skilled therapy to improve mobility, reduce muscle tension, reduce pain and improve overall function during her pregnancy.    Personal Factors and Comorbidities Comorbidity 1;Time since onset of injury/illness/exacerbation;Fitness    Examination-Activity Limitations Bed Mobility;Bend;Carry;Continence;Lift;Caring for Others     Examination-Participation Restrictions Interpersonal Relationship;Meal Prep    Stability/Clinical Decision Making Evolving/Moderate complexity    Clinical Decision Making Low    Rehab Potential Excellent    PT Frequency 1x / week    PT Duration 12 weeks    PT Treatment/Interventions ADLs/Self Care Home Management;Moist Heat;Cryotherapy;Therapeutic activities;Therapeutic exercise;Neuromuscular re-education;Patient/family education;Manual techniques;Passive range of motion;Spinal Manipulations    PT Next Visit Plan manual work to the back and right lower abdominal, and pelvic floor; correct pelvis, expansion of the rib cage, education on using a ball to massage the pelvic floor, hip stretches in sitting    Consulted and Agree with Plan of Care Patient           Patient will benefit from skilled therapeutic intervention in order to improve the following deficits and impairments:  Decreased endurance,Decreased activity tolerance,Decreased strength,Increased fascial restricitons,Pain,Increased muscle spasms,Decreased coordination  Visit Diagnosis: Muscle weakness (generalized) - Plan: PT plan of care cert/re-cert  Cramp and spasm - Plan: PT plan of care cert/re-cert  Other  lack of coordination - Plan: PT plan of care cert/re-cert     Problem List Patient Active Problem List   Diagnosis Date Noted   Pelvic pain affecting pregnancy in second trimester, antepartum 09/20/2020   History of IUFD 06/16/2020   Rh negative state in antepartum period 06/16/2020   Depression affecting pregnancy, antepartum 06/16/2020   Supervision of normal pregnancy 06/09/2020   Contact dermatitis 12/29/2019   Class 2 obesity due to excess calories without serious comorbidity with body mass index (BMI) of 39.0 to 39.9 in adult 03/04/2017    Eulis Fosterheryl Raju Coppolino, PT 10/11/20 11:55 AM   [facility] [address], [address] [place], [place], [address] Phone:  [phone]   Fax:  [phone]  Name: Allison Oconnell MRN: [id number] Date of Birth: [birth year]

## 2020-10-18 ENCOUNTER — Encounter: Payer: BC Managed Care – PPO | Attending: Advanced Practice Midwife | Admitting: Physical Therapy

## 2020-10-18 ENCOUNTER — Encounter: Payer: Self-pay | Admitting: Physical Therapy

## 2020-10-18 ENCOUNTER — Other Ambulatory Visit: Payer: Self-pay

## 2020-10-18 ENCOUNTER — Encounter: Payer: BC Managed Care – PPO | Admitting: Advanced Practice Midwife

## 2020-10-18 DIAGNOSIS — O99891 Other specified diseases and conditions complicating pregnancy: Secondary | ICD-10-CM | POA: Diagnosis not present

## 2020-10-18 DIAGNOSIS — R102 Pelvic and perineal pain: Secondary | ICD-10-CM | POA: Diagnosis not present

## 2020-10-18 DIAGNOSIS — Z8759 Personal history of other complications of pregnancy, childbirth and the puerperium: Secondary | ICD-10-CM | POA: Insufficient documentation

## 2020-10-18 DIAGNOSIS — M6281 Muscle weakness (generalized): Secondary | ICD-10-CM | POA: Diagnosis not present

## 2020-10-18 DIAGNOSIS — O26892 Other specified pregnancy related conditions, second trimester: Secondary | ICD-10-CM | POA: Insufficient documentation

## 2020-10-18 DIAGNOSIS — R252 Cramp and spasm: Secondary | ICD-10-CM

## 2020-10-18 DIAGNOSIS — Z3A00 Weeks of gestation of pregnancy not specified: Secondary | ICD-10-CM | POA: Insufficient documentation

## 2020-10-18 DIAGNOSIS — R278 Other lack of coordination: Secondary | ICD-10-CM | POA: Diagnosis not present

## 2020-10-18 NOTE — Patient Instructions (Signed)
Access Code: [email or web address] URL: [email or web address] Date: 10/18/2020 Prepared by: Eulis Foster  Exercises Seated Piriformis Stretch with Trunk Bend - 1 x daily - 7 x weekly - 1 sets - 1 reps - 30 sec hold Seated Hamstring Stretch - 1 x daily - 7 x weekly - 1 sets - 1 reps - 30 sec hold Seated Hip Adductor Stretch - 1 x daily - 7 x weekly - 1 sets - 1 reps - 30 sec hold Seated Hip Flexor Stretch - 1 x daily - 7 x weekly - 1 sets - 1 reps - 30 sec hold Quadruped Cat Camel - 1 x daily - 7 x weekly - 1 sets - 10 reps Hip Hinge Rock Back - 1 x daily - 7 x weekly - 1 sets - 10 reps Seated Swiss Ball Pelvic Circles - 1 x daily - 7 x weekly - 1 sets - 10 reps Seated Lateral Pelvic Tilt on Swiss Ball - 1 x daily - 7 x weekly - 1 sets - 10 reps Pelvic Tilt on Swiss Ball - 1 x daily - 7 x weekly - 1 sets - 10 reps Eulis Foster, PT [facility] [address], [address] [place], [place] [address] W: [phone] [email or web address]

## 2020-10-18 NOTE — Therapy (Signed)
[facility] at [facility] [address] [place], [place], [address] Phone: [phone]   Fax:  [phone]  Physical Therapy Treatment  Patient Details  Name: Allison Oconnell MRN: [id number] Date of Birth: 01-08-1991 Referring Provider (PT): Sharen Counter,  CNM   Encounter Date: 10/18/2020   PT End of Session - 10/18/20 1028    Visit Number 2    Date for PT Re-Evaluation 01/03/21    Authorization Type BCBS    PT Start Time [phone]    PT Stop Time 1020    PT Time Calculation (min) 46 min    Activity Tolerance Patient tolerated treatment well;No increased pain    Behavior During Therapy WFL for tasks assessed/performed           Past Medical History:  Diagnosis Date  . Bicornuate uterus   . Depression   . Obesity     Past Surgical History:  Procedure Laterality Date  . GALLBLADDER SURGERY     may 2009    There were no vitals filed for this visit.   Subjective Assessment - 10/18/20 0937    Subjective I was a little sore after last visit. The breathing helped the constipation. I saw the chiroprator yesterday.    Patient Stated Goals reduce pain    Currently in Pain? Yes    Pain Score 4     Pain Location Pelvis    Pain Orientation Right;Left;Mid    Pain Descriptors / Indicators Pins and needles;Sore;Tender    Pain Type Chronic pain    Pain Onset More than a month ago    Pain Frequency Constant    Aggravating Factors  cleaning the bathroom, alot of walking, bending over, intercourse afterwards but not during intercourse    Pain Relieving Factors stretch, lay down, limit activity, pregnancy pillow at night    Multiple Pain Sites No                             [facility] Adult PT Treatment/Exercise - 10/18/20 0001      Lumbar Exercises: Stretches   Active Hamstring Stretch Right;Left;1 rep;30 seconds    Active Hamstring Stretch Limitations sitting    Hip Flexor Stretch Right;Left;1 rep;30 seconds     Hip Flexor Stretch Limitations sitting    Piriformis Stretch Left;Right;1 rep;30 seconds    Piriformis Stretch Limitations sitting      Lumbar Exercises: Seated   Other Seated Lumbar Exercises sit on physioball with hip circles, pelvic sway, figure eight      Lumbar Exercises: Quadruped   Madcat/Old Horse 10 reps    Other Quadruped Lumbar Exercises hip sway, hip hinge      Manual Therapy   Manual Therapy Soft tissue mobilization    Soft tissue mobilization right quadratus, psoas, along the right lower abdominals, bilateral levator ani, bil. ischiococcygeus in left sidely                  PT Education - 10/18/20 1025    Education Details Access Code: [email or web address]    Person(s) Educated Patient    Methods Explanation;Demonstration;Verbal cues;Handout    Comprehension Verbalized understanding;Returned demonstration            PT Short Term Goals - 10/11/20 1143      PT SHORT TERM GOAL #1   Title independent with initial HEP    Time 4    Period Weeks    Status New  Target Date 11/08/20      PT SHORT TERM GOAL #2   Title understand how to use pillows to support her back, and abdomen to reduce strain on the pelvis    Time 4    Period Weeks    Status New    Target Date 11/08/20      PT SHORT TERM GOAL #3   Title able to perfrom pelvic drop to elongate the pelvic floor    Time 4    Period Weeks    Status New    Target Date 11/08/20      PT SHORT TERM GOAL #4   Title understand ways to move in bed and during daily activities to reduce strain on the pelvis    Time 4    Period Weeks    Status New    Target Date 11/08/20             PT Long Term Goals - 10/11/20 1145      PT LONG TERM GOAL #1   Title independent with advanced HEP and how to perform perineal massage to reduce chance of tearing    Time 12    Period Weeks    Status New    Target Date 01/03/21      PT LONG TERM GOAL #2   Title able to have penile penetration with pain decreased >/= 50%  afterwards    Time 12    Period Weeks    Status New    Target Date 01/03/21      PT LONG TERM GOAL #3   Title pain with daily activities decreased </= 4/10 due to improved elongation of the pelvic muscles and understanding ways to manage her pain.    Time 12    Period Weeks    Status New    Target Date 01/03/21      PT LONG TERM GOAL #4   Title understand ways to give birth with different positions to reduce the pelvic pain and transition the baby into the canal    Time 12    Period Weeks    Status New    Target Date 01/03/21                 Plan - 10/18/20 0941    Clinical Impression Statement Patient reports the exercises are helping her have a bowel movement. Patient has learned stretches to elongate the hip and pelvic floor muscles to reduce pain. Patient has tenderness located in the right quadratus, right hip external rotators, and right ischiococcygeus. Patient has been keeping her hips adducted with movement to reduce the pain in the symphysis pubis. Patient will bring her SI belt to therapy next time to go over on how to use it. Patient will benefit from skilled therapy to improve, mobility, reduce muscle tension, reduce pain and improve overall function during pregnancy.    Personal Factors and Comorbidities Comorbidity 1;Time since onset of injury/illness/exacerbation;Fitness    Examination-Activity Limitations Bed Mobility;Bend;Carry;Continence;Lift;Caring for Others    Examination-Participation Restrictions Interpersonal Relationship;Meal Prep    Stability/Clinical Decision Making Evolving/Moderate complexity    Rehab Potential Excellent    PT Frequency 1x / week    PT Duration 12 weeks    PT Treatment/Interventions ADLs/Self Care Home Management;Moist Heat;Cryotherapy;Therapeutic activities;Therapeutic exercise;Neuromuscular re-education;Patient/family education;Manual techniques;Passive range of motion;Spinal Manipulations    PT Next Visit Plan manual work to the  back and right lower abdominal, and pelvic floor; correct pelvis, expansion of the rib cage, sidely  trunk twist with pillow between legs, sidely pull backs, qudruped inhale to expand back, band around the knees and weight shift with firing lower abdominals; review using pillows for bed, go over wearing SI belt    PT Home Exercise Plan Access Code: [email or web address]    Recommended Other Services MD signed initial eval    Consulted and Agree with Plan of Care Patient           Patient will benefit from skilled therapeutic intervention in order to improve the following deficits and impairments:  Decreased endurance,Decreased activity tolerance,Decreased strength,Increased fascial restricitons,Pain,Increased muscle spasms,Decreased coordination  Visit Diagnosis: Muscle weakness (generalized)  Cramp and spasm  Other lack of coordination     Problem List Patient Active Problem List   Diagnosis Date Noted  . Pelvic pain affecting pregnancy in second trimester, antepartum 09/20/2020  . History of IUFD 06/16/2020  . Rh negative state in antepartum period 06/16/2020  . Depression affecting pregnancy, antepartum 06/16/2020  . Supervision of normal pregnancy 06/09/2020  . Contact dermatitis 12/29/2019  . Class 2 obesity due to excess calories without serious comorbidity with body mass index (BMI) of 39.0 to 39.9 in adult 03/04/2017    Eulis Foster, PT 10/18/20 10:44 AM   [place] [facility] at [facility] [address], [address] [place], [place], [address] Phone: [phone]   Fax:  [phone]  Name: Allison Oconnell MRN: [id number] Date of Birth: [birth year]

## 2020-10-19 ENCOUNTER — Ambulatory Visit (INDEPENDENT_AMBULATORY_CARE_PROVIDER_SITE_OTHER): Payer: BC Managed Care – PPO | Admitting: Obstetrics & Gynecology

## 2020-10-19 VITALS — BP 105/75 | HR 93 | Wt 216.0 lb

## 2020-10-19 DIAGNOSIS — Z6791 Unspecified blood type, Rh negative: Secondary | ICD-10-CM

## 2020-10-19 DIAGNOSIS — Z3483 Encounter for supervision of other normal pregnancy, third trimester: Secondary | ICD-10-CM

## 2020-10-19 DIAGNOSIS — Z6839 Body mass index (BMI) 39.0-39.9, adult: Secondary | ICD-10-CM

## 2020-10-19 DIAGNOSIS — E6609 Other obesity due to excess calories: Secondary | ICD-10-CM

## 2020-10-19 DIAGNOSIS — Z8759 Personal history of other complications of pregnancy, childbirth and the puerperium: Secondary | ICD-10-CM

## 2020-10-19 DIAGNOSIS — O26899 Other specified pregnancy related conditions, unspecified trimester: Secondary | ICD-10-CM

## 2020-10-19 DIAGNOSIS — Z3A29 29 weeks gestation of pregnancy: Secondary | ICD-10-CM

## 2020-10-19 NOTE — Progress Notes (Signed)
   PRENATAL VISIT NOTE  Subjective:  Allison Oconnell is a 30 y.o. [id number] at [redacted]w[redacted]d being seen today for ongoing prenatal care.  She is currently monitored for the following issues for this high-risk pregnancy and has Class 2 obesity due to excess calories without serious comorbidity with body mass index (BMI) of 39.0 to 39.9 in adult; Contact dermatitis; Supervision of normal pregnancy; History of IUFD; Rh negative state in antepartum period; Depression affecting pregnancy, antepartum; and Pelvic pain affecting pregnancy in second trimester, antepartum on their problem list.  Patient reports no complaints.  Contractions: Not present. Vag. Bleeding: None.  Movement: Present. Denies leaking of fluid.   The following portions of the patient's history were reviewed and updated as appropriate: allergies, current medications, past family history, past medical history, past social history, past surgical history and problem list.   Objective:   Vitals:   10/19/20 1307  BP: 105/75  Pulse: 93  Weight: 216 lb (98 kg)    Fetal Status: Fetal Heart Rate (bpm): 146   Movement: Present     General:  Alert, oriented and cooperative. Patient is in no acute distress.  Skin: Skin is warm and dry. No rash noted.   Cardiovascular: Normal heart rate noted  Respiratory: Normal respiratory effort, no problems with respiration noted  Abdomen: Soft, gravid, appropriate for gestational age.  Pain/Pressure: Present     Pelvic: Cervical exam deferred        Extremities: Normal range of motion.  Edema: None  Mental Status: Normal mood and affect. Normal behavior. Normal judgment and thought content.   Assessment and Plan:  Pregnancy: G5P2102 at [redacted]w[redacted]d 1. [redacted] weeks gestation of pregnancy - Continue PNV and baby ASA - recheck 2 weeks.  2. Encounter for supervision of other normal pregnancy in third trimester  3. History of IUFD - growth scan ordered.  Pt has previously declined this but willing to proceed  today.  4. Rh negative state in antepartum period - received rhogam 10/10/2020  5. Class 2 obesity due to excess calories without serious comorbidity with body mass index (BMI) of 39.0 to 39.9 in adult   Preterm labor symptoms and general obstetric precautions including but not limited to vaginal bleeding, contractions, leaking of fluid and fetal movement were reviewed in detail with the patient. Please refer to After Visit Summary for other counseling recommendations.   Return in about 2 weeks (around 11/02/2020).  Future Appointments  Date Time Provider Department Center  10/25/2020  8:30 AM Theressa Millard, PT [facility]  11/01/2020  9:30 AM Theressa Millard, PT [facility]  11/03/2020  9:15 AM Conan Bowens, MD [facility]  11/08/2020  1:00 PM Theressa Millard, PT [facility]  11/15/2020  1:00 PM Theressa Millard, PT [facility]  11/17/2020  9:00 AM Conan Bowens, MD [facility]  11/22/2020 11:30 AM Theressa Millard, PT [facility]  12/01/2020  9:15 AM Conan Bowens, MD [facility]  12/08/2020  9:15 AM Conan Bowens, MD [facility]    Jerene Bears, MD

## 2020-10-25 ENCOUNTER — Encounter: Payer: BC Managed Care – PPO | Admitting: Physical Therapy

## 2020-10-25 ENCOUNTER — Encounter: Payer: Self-pay | Admitting: Physical Therapy

## 2020-10-25 ENCOUNTER — Other Ambulatory Visit: Payer: Self-pay

## 2020-10-25 DIAGNOSIS — R278 Other lack of coordination: Secondary | ICD-10-CM

## 2020-10-25 DIAGNOSIS — R102 Pelvic and perineal pain: Secondary | ICD-10-CM | POA: Diagnosis not present

## 2020-10-25 DIAGNOSIS — M6281 Muscle weakness (generalized): Secondary | ICD-10-CM | POA: Diagnosis not present

## 2020-10-25 DIAGNOSIS — Z3A00 Weeks of gestation of pregnancy not specified: Secondary | ICD-10-CM | POA: Diagnosis not present

## 2020-10-25 DIAGNOSIS — O26892 Other specified pregnancy related conditions, second trimester: Secondary | ICD-10-CM | POA: Diagnosis not present

## 2020-10-25 DIAGNOSIS — Z8759 Personal history of other complications of pregnancy, childbirth and the puerperium: Secondary | ICD-10-CM | POA: Diagnosis not present

## 2020-10-25 DIAGNOSIS — R252 Cramp and spasm: Secondary | ICD-10-CM

## 2020-10-25 DIAGNOSIS — O99891 Other specified diseases and conditions complicating pregnancy: Secondary | ICD-10-CM | POA: Diagnosis not present

## 2020-10-25 NOTE — Therapy (Signed)
[facility] at [facility] [address] [place], [place], [address] Phone: [phone]   Fax:  [phone]  Physical Therapy Treatment  Patient Details  Name: Allison Oconnell MRN: [id number] Date of Birth: 1991/08/25 Referring Provider (PT): Sharen Counter,  CNM   Encounter Date: 10/25/2020   PT End of Session - 10/25/20 1308    Visit Number 3    Date for PT Re-Evaluation 01/03/21    Authorization Type BCBS    PT Start Time 1306    PT Stop Time 1350    PT Time Calculation (min) 44 min    Activity Tolerance Patient tolerated treatment well;No increased pain    Behavior During Therapy WFL for tasks assessed/performed           Past Medical History:  Diagnosis Date  . Bicornuate uterus   . Depression   . Obesity     Past Surgical History:  Procedure Laterality Date  . GALLBLADDER SURGERY     may 2009    There were no vitals filed for this visit.   Subjective Assessment - 10/25/20 1310    Subjective I have done my stretches. I am still today. My hips hurting when I sleep. I am 32 weeks today.    Patient Stated Goals reduce pain    Currently in Pain? Yes    Pain Score 5     Pain Location Pelvis    Pain Orientation Right;Left;Mid    Pain Descriptors / Indicators Pins and needles;Sore;Tender    Pain Type Chronic pain    Pain Onset More than a month ago    Pain Frequency Constant    Aggravating Factors  cleaning the bathroom, alot of walking, bending over, intercourse afterwards but not during intercourse    Pain Relieving Factors stretch, lay donw, limit activity, pregnancy pillow at night    Multiple Pain Sites No                             [facility] Adult PT Treatment/Exercise - 10/25/20 0001      Lumbar Exercises: Seated   Other Seated Lumbar Exercises sit on physioball with hip circles, pelvic sway, figure eight      Lumbar Exercises: Sidelying   Other Sidelying Lumbar Exercises pull backs  with roll between knees 15x each side keeping spinal neutral      Lumbar Exercises: Quadruped   Madcat/Old Horse 10 reps    Other Quadruped Lumbar Exercises pull backs wto improve SI stability 15x each side and tactile cues to not arch back                    PT Short Term Goals - 10/25/20 1354      PT SHORT TERM GOAL #1   Title independent with initial HEP    Time 4    Period Weeks    Status Achieved      PT SHORT TERM GOAL #2   Title understand how to use pillows to support her back, and abdomen to reduce strain on the pelvis    Time 4    Period Weeks    Status Achieved      PT SHORT TERM GOAL #3   Title able to perfrom pelvic drop to elongate the pelvic floor    Time 4    Period Weeks    Status Achieved      PT SHORT TERM GOAL #4  Title understand ways to move in bed and during daily activities to reduce strain on the pelvis    Time 4    Period Weeks    Status Achieved             PT Long Term Goals - 10/25/20 1315      PT LONG TERM GOAL #1   Title independent with advanced HEP and how to perform perineal massage to reduce chance of tearing    Time 12    Period Weeks    Status On-going      PT LONG TERM GOAL #2   Title able to have penile penetration with pain decreased >/= 50% afterwards    Time 12    Period Weeks    Status Achieved      PT LONG TERM GOAL #3   Title pain with daily activities decreased </= 4/10 due to improved elongation of the pelvic muscles and understanding ways to manage her pain.    Time 12    Period Weeks    Status On-going      PT LONG TERM GOAL #4   Title understand ways to give birth with different positions to reduce the pelvic pain and transition the baby into the canal    Time 12    Period Weeks    Status On-going                 Plan - 10/25/20 1309    Clinical Impression Statement Patient has been doing her diaphragmatic breathing to relax her pelvic floor and decrease pain. Patient pelvis in  correct alignment. she is having less pelvic pain to level 5/10. Patient was learning how to engage the gluteal and hamstring to stabilze the pelvis. She will be blowing up her physioball to do some exercises on it. Patient has difficulty with laying on her sides for long due to her hip pains. Patient will benefit from skilled therapy to improve mobility, reduce muscle tension, reduce pain and improve overall function during pregnancy.    Personal Factors and Comorbidities Comorbidity 1;Time since onset of injury/illness/exacerbation;Fitness    Comorbidities pregnant    Examination-Activity Limitations Bed Mobility;Bend;Carry;Continence;Lift;Caring for Others    Examination-Participation Restrictions Interpersonal Relationship;Meal Prep    Stability/Clinical Decision Making Evolving/Moderate complexity    Rehab Potential Excellent    PT Frequency 1x / week    PT Duration 12 weeks    PT Treatment/Interventions ADLs/Self Care Home Management;Moist Heat;Cryotherapy;Therapeutic activities;Therapeutic exercise;Neuromuscular re-education;Patient/family education;Manual techniques;Passive range of motion;Spinal Manipulations    PT Next Visit Plan See if she has the SI belt, Pallof exercise, pullbacks in sidely and quadruped, gluteal and hamstring strenght with knee flexion sitting,    PT Home Exercise Plan Access Code: [email or web address]    Consulted and Agree with Plan of Care Patient           Patient will benefit from skilled therapeutic intervention in order to improve the following deficits and impairments:  Decreased endurance,Decreased activity tolerance,Decreased strength,Increased fascial restricitons,Pain,Increased muscle spasms,Decreased coordination  Visit Diagnosis: Muscle weakness (generalized)  Cramp and spasm  Other lack of coordination     Problem List Patient Active Problem List   Diagnosis Date Noted  . Pelvic pain affecting pregnancy in second trimester, antepartum 09/20/2020  .  History of IUFD 06/16/2020  . Rh negative state in antepartum period 06/16/2020  . Depression affecting pregnancy, antepartum 06/16/2020  . Supervision of normal pregnancy 06/09/2020  . Contact dermatitis 12/29/2019  . Class 2 obesity  due to excess calories without serious comorbidity with body mass index (BMI) of 39.0 to 39.9 in adult 03/04/2017    Eulis Foster, PT 10/25/20 1:59 PM   [place] [facility] at [facility] [address] [place], [place], [address] Phone: [phone]   Fax:  [phone]  Name: Allison Oconnell MRN: [id number] Date of Birth: 1990-12-27

## 2020-11-01 ENCOUNTER — Encounter: Payer: BC Managed Care – PPO | Admitting: Physical Therapy

## 2020-11-03 ENCOUNTER — Ambulatory Visit (INDEPENDENT_AMBULATORY_CARE_PROVIDER_SITE_OTHER): Payer: BC Managed Care – PPO | Admitting: Obstetrics and Gynecology

## 2020-11-03 ENCOUNTER — Encounter: Payer: Self-pay | Admitting: Obstetrics and Gynecology

## 2020-11-03 ENCOUNTER — Other Ambulatory Visit: Payer: Self-pay

## 2020-11-03 VITALS — BP 94/61 | HR 102 | Wt 219.0 lb

## 2020-11-03 DIAGNOSIS — Z6791 Unspecified blood type, Rh negative: Secondary | ICD-10-CM

## 2020-11-03 DIAGNOSIS — Z3483 Encounter for supervision of other normal pregnancy, third trimester: Secondary | ICD-10-CM

## 2020-11-03 DIAGNOSIS — Z6839 Body mass index (BMI) 39.0-39.9, adult: Secondary | ICD-10-CM

## 2020-11-03 DIAGNOSIS — Z3A32 32 weeks gestation of pregnancy: Secondary | ICD-10-CM

## 2020-11-03 DIAGNOSIS — O99340 Other mental disorders complicating pregnancy, unspecified trimester: Secondary | ICD-10-CM

## 2020-11-03 DIAGNOSIS — Z8759 Personal history of other complications of pregnancy, childbirth and the puerperium: Secondary | ICD-10-CM

## 2020-11-03 DIAGNOSIS — E6609 Other obesity due to excess calories: Secondary | ICD-10-CM

## 2020-11-03 DIAGNOSIS — O26899 Other specified pregnancy related conditions, unspecified trimester: Secondary | ICD-10-CM

## 2020-11-03 DIAGNOSIS — F32A Depression, unspecified: Secondary | ICD-10-CM

## 2020-11-03 NOTE — Progress Notes (Signed)
   PRENATAL VISIT NOTE  Subjective:  Allison Oconnell is a 30 y.o. [id number] at [redacted]w[redacted]d being seen today for ongoing prenatal care.  She is currently monitored for the following issues for this high-risk pregnancy and has Class 2 obesity due to excess calories without serious comorbidity with body mass index (BMI) of 39.0 to 39.9 in adult; Contact dermatitis; Supervision of normal pregnancy; History of IUFD; Rh negative state in antepartum period; Depression affecting pregnancy, antepartum; and Pelvic pain affecting pregnancy in second trimester, antepartum on their problem list.  Patient reports no complaints.  Contractions: Not present. Vag. Bleeding: None.  Movement: Present. Denies leaking of fluid.   The following portions of the patient's history were reviewed and updated as appropriate: allergies, current medications, past family history, past medical history, past social history, past surgical history and problem list.   Objective:   Vitals:   11/03/20 0931  BP: 94/61  Pulse: (!) 102  Weight: 219 lb (99.3 kg)    Fetal Status: Fetal Heart Rate (bpm): 156 Fundal Height: 34 cm Movement: Present     General:  Alert, oriented and cooperative. Patient is in no acute distress.  Skin: Skin is warm and dry. No rash noted.   Cardiovascular: Normal heart rate noted  Respiratory: Normal respiratory effort, no problems with respiration noted  Abdomen: Soft, gravid, appropriate for gestational age.  Pain/Pressure: Present     Pelvic: Cervical exam deferred        Extremities: Normal range of motion.  Edema: None  Mental Status: Normal mood and affect. Normal behavior. Normal judgment and thought content.   Assessment and Plan:  Pregnancy: G5P2102 at [redacted]w[redacted]d  1. Rh negative state in antepartum period S/p Rho gam  2. History of IUFD Due to coxsackie disease @ 28 weeks Has f/u growth 11/15/20  3. Encounter for supervision of other normal pregnancy in third trimester  4. Class 2 obesity due to  excess calories without serious comorbidity with body mass index (BMI) of 39.0 to 39.9 in adult  5. Depression affecting pregnancy, antepartum  6. [redacted] weeks gestation of pregnancy   Preterm labor symptoms and general obstetric precautions including but not limited to vaginal bleeding, contractions, leaking of fluid and fetal movement were reviewed in detail with the patient. Please refer to After Visit Summary for other counseling recommendations.   Return in about 2 weeks (around 11/17/2020) for virtual, high OB.  Future Appointments  Date Time Provider Department Center  11/08/2020  1:00 PM Leo Rod [facility]  11/15/2020 11:00 AM [facility]  11/15/2020  1:00 PM Theressa Millard, PT [facility]  11/17/2020  9:00 AM Conan Bowens, MD [facility]  11/22/2020 11:30 AM Theressa Millard, PT [facility]  12/01/2020  9:15 AM Conan Bowens, MD [facility]  12/08/2020  9:15 AM Conan Bowens, MD [facility]    Conan Bowens, MD

## 2020-11-04 ENCOUNTER — Ambulatory Visit: Payer: BC Managed Care – PPO

## 2020-11-08 ENCOUNTER — Other Ambulatory Visit: Payer: Self-pay

## 2020-11-08 ENCOUNTER — Encounter: Payer: Self-pay | Admitting: Physical Therapy

## 2020-11-08 ENCOUNTER — Encounter: Payer: BC Managed Care – PPO | Admitting: Physical Therapy

## 2020-11-08 DIAGNOSIS — R252 Cramp and spasm: Secondary | ICD-10-CM

## 2020-11-08 DIAGNOSIS — R278 Other lack of coordination: Secondary | ICD-10-CM | POA: Diagnosis not present

## 2020-11-08 DIAGNOSIS — Z8759 Personal history of other complications of pregnancy, childbirth and the puerperium: Secondary | ICD-10-CM | POA: Diagnosis not present

## 2020-11-08 DIAGNOSIS — M6281 Muscle weakness (generalized): Secondary | ICD-10-CM

## 2020-11-08 DIAGNOSIS — O26892 Other specified pregnancy related conditions, second trimester: Secondary | ICD-10-CM | POA: Diagnosis not present

## 2020-11-08 DIAGNOSIS — R102 Pelvic and perineal pain: Secondary | ICD-10-CM | POA: Diagnosis not present

## 2020-11-08 DIAGNOSIS — Z3A00 Weeks of gestation of pregnancy not specified: Secondary | ICD-10-CM | POA: Diagnosis not present

## 2020-11-08 DIAGNOSIS — O99891 Other specified diseases and conditions complicating pregnancy: Secondary | ICD-10-CM | POA: Diagnosis not present

## 2020-11-08 NOTE — Therapy (Signed)
[facility] at [facility] [address] [place], [place], [address] Phone: [phone]   Fax:  [phone]  Physical Therapy Treatment  Patient Details  Name: Allison Oconnell MRN: [id number] Date of Birth: 01-22-1991 Referring Provider (PT): Sharen Counter,  CNM   Encounter Date: 11/08/2020   PT End of Session - 11/08/20 1359    Visit Number 4    Date for PT Re-Evaluation 01/03/21    Authorization Type [employer]    PT Start Time 1300    PT Stop Time 1350    PT Time Calculation (min) 50 min    Activity Tolerance Patient tolerated treatment well;No increased pain    Behavior During Therapy WFL for tasks assessed/performed           Past Medical History:  Diagnosis Date  . Bicornuate uterus   . Depression   . Obesity     Past Surgical History:  Procedure Laterality Date  . GALLBLADDER SURGERY     may 2009    There were no vitals filed for this visit.   Subjective Assessment - 11/08/20 1308    Subjective I am making it a habit to not arch my back as much. I have a semi ball that goes on my chair and it is helping. Pain in pelvis is more manageable. The hip pain is bad a night. Have not tried the SI belt.    Patient Stated Goals reduce pain    Currently in Pain? Yes    Pain Score 4     Pain Location Pelvis    Pain Orientation Right;Left;Mid    Pain Descriptors / Indicators Pins and needles;Sore;Tender    Pain Type Chronic pain    Pain Onset More than a month ago    Pain Frequency Constant    Aggravating Factors  cleaning the bathroom, alot of walking, bending over, intercourse afterwards but not during intercourse    Pain Relieving Factors stretch, lay down, limit activity, pregnancy pillow at night    Multiple Pain Sites Yes    Pain Score 8    Pain Location Hip    Pain Orientation Right;Left    Pain Descriptors / Indicators Sore;Pressure    Pain Type Acute pain    Pain Onset More than a month ago    Pain  Frequency Intermittent    Aggravating Factors  laying on them    Pain Relieving Factors changing positions, rocking when sitting                             [facility] Adult PT Treatment/Exercise - 11/08/20 0001      Self-Care   Self-Care Other Self-Care Comments    Other Self-Care Comments  education on using a rolling pin to the lateral thgh and quadricep      Therapeutic Activites    Therapeutic Activities Other Therapeutic Activities    Other Therapeutic Activities education on different ways to give birth so she is not on her back, education on bringing the baby throug the stages of the pelvis to reduce strain on her lumbar      Lumbar Exercises: Stretches   ITB Stretch Right;Left;1 rep;30 seconds    ITB Stretch Limitations standing      Lumbar Exercises: Sidelying   Other Sidelying Lumbar Exercises pull backs with roll between knees 15x each side keeping spinal neutral      Manual Therapy   Manual Therapy Soft tissue  mobilization    Soft tissue mobilization using the addaday to bilateral gluteal, ITB, hamstring, around the greater trochanter, and lateral quadricep                  PT Education - 11/08/20 1358    Education Details Access Code: [email or web address]; educated ways to massage her outer thighs at home; different stages of labor to bring the baby through the birthing canal    Person(s) Educated Patient    Methods Explanation;Demonstration;Verbal cues;Handout    Comprehension Returned demonstration;Verbalized understanding            PT Short Term Goals - 10/25/20 1354      PT SHORT TERM GOAL #1   Title independent with initial HEP    Time 4    Period Weeks    Status Achieved      PT SHORT TERM GOAL #2   Title understand how to use pillows to support her back, and abdomen to reduce strain on the pelvis    Time 4    Period Weeks    Status Achieved      PT SHORT TERM GOAL #3   Title able to perfrom pelvic drop to elongate the pelvic floor     Time 4    Period Weeks    Status Achieved      PT SHORT TERM GOAL #4   Title understand ways to move in bed and during daily activities to reduce strain on the pelvis    Time 4    Period Weeks    Status Achieved             PT Long Term Goals - 11/08/20 1359      PT LONG TERM GOAL #1   Title independent with advanced HEP and how to perform perineal massage to reduce chance of tearing    Time 12    Period Weeks    Status On-going      PT LONG TERM GOAL #2   Title able to have penile penetration with pain decreased >/= 50% afterwards    Time 12    Period Weeks    Status Achieved      PT LONG TERM GOAL #3   Title pain with daily activities decreased </= 4/10 due to improved elongation of the pelvic muscles and understanding ways to manage her pain.    Baseline for the pelvis but the hip pain is 8/10    Time 12    Period Weeks    Status On-going      PT LONG TERM GOAL #4   Title understand ways to give birth with different positions to reduce the pelvic pain and transition the baby into the canal    Time 12    Period Weeks    Status On-going                 Plan - 11/08/20 1401    Clinical Impression Statement Patient pelvic pain is more manageable at level 3-4/10. Patient is having more hip pain that travels down her thighs. Patient has tendenress located in the ITB and greater trochanters. Patient understands the tools to use for manual work to the muscles to manage her lateral thigh pain. Patient is starting to be educated on different positions for birthing to reduce her back and hip pain. Patient is doing her HEP. Patient will benefit from skilled therapy to improve mobility, reduce muscle tension, reduce pain and improve overall function during pregnancy.  Personal Factors and Comorbidities Comorbidity 1;Time since onset of injury/illness/exacerbation;Fitness    Comorbidities pregnant    Examination-Activity Limitations Bed  Mobility;Bend;Carry;Continence;Lift;Caring for Others    Examination-Participation Restrictions Interpersonal Relationship;Meal Prep    Stability/Clinical Decision Making Evolving/Moderate complexity    Rehab Potential Excellent    PT Frequency 1x / week    PT Duration 12 weeks    PT Treatment/Interventions ADLs/Self Care Home Management;Moist Heat;Cryotherapy;Therapeutic activities;Therapeutic exercise;Neuromuscular re-education;Patient/family education;Manual techniques;Passive range of motion;Spinal Manipulations    PT Next Visit Plan See if she has the SI belt, Pallof exercise, pullbacks in sidely and quadruped, gluteal and hamstring strenght with knee flexion sitting, REview birthing process, perineal massage    PT Home Exercise Plan Access Code: [email or web address]    Consulted and Agree with Plan of Care Patient           Patient will benefit from skilled therapeutic intervention in order to improve the following deficits and impairments:  Decreased endurance,Decreased activity tolerance,Decreased strength,Increased fascial restricitons,Pain,Increased muscle spasms,Decreased coordination  Visit Diagnosis: Muscle weakness (generalized)  Cramp and spasm  Other lack of coordination     Problem List Patient Active Problem List   Diagnosis Date Noted  . Pelvic pain affecting pregnancy in second trimester, antepartum 09/20/2020  . History of IUFD 06/16/2020  . Rh negative state in antepartum period 06/16/2020  . Depression affecting pregnancy, antepartum 06/16/2020  . Supervision of normal pregnancy 06/09/2020  . Contact dermatitis 12/29/2019  . Class 2 obesity due to excess calories without serious comorbidity with body mass index (BMI) of 39.0 to 39.9 in adult 03/04/2017    Eulis Foster, PT 11/08/20 2:10 PM    [facility] [address], [address] [place], [place], [address] Phone: [phone]   Fax:  [phone]  Name:  Allison Oconnell MRN: [id number] Date of Birth: Nov 14, 1990

## 2020-11-08 NOTE — Patient Instructions (Signed)
Access Code: [email or web address] URL: [email or web address] Date: 10/18/2020 Prepared by: Eulis Foster  Exercises ITB Stretch at Wall - 1 x daily - 4 x weekly - 1 sets - 2 reps - 30 sec hold Eulis Foster, PT [facility] [address] [place], [place] [address] W: [phone] [email or web address]

## 2020-11-15 ENCOUNTER — Encounter: Payer: BC Managed Care – PPO | Attending: Advanced Practice Midwife | Admitting: Physical Therapy

## 2020-11-15 ENCOUNTER — Encounter: Payer: Self-pay | Admitting: Physical Therapy

## 2020-11-15 ENCOUNTER — Ambulatory Visit: Payer: BC Managed Care – PPO | Attending: Obstetrics and Gynecology

## 2020-11-15 ENCOUNTER — Other Ambulatory Visit: Payer: Self-pay

## 2020-11-15 ENCOUNTER — Ambulatory Visit: Payer: BC Managed Care – PPO | Admitting: *Deleted

## 2020-11-15 ENCOUNTER — Encounter: Payer: Self-pay | Admitting: *Deleted

## 2020-11-15 DIAGNOSIS — R278 Other lack of coordination: Secondary | ICD-10-CM | POA: Diagnosis not present

## 2020-11-15 DIAGNOSIS — Z3483 Encounter for supervision of other normal pregnancy, third trimester: Secondary | ICD-10-CM | POA: Insufficient documentation

## 2020-11-15 DIAGNOSIS — O26899 Other specified pregnancy related conditions, unspecified trimester: Secondary | ICD-10-CM | POA: Diagnosis not present

## 2020-11-15 DIAGNOSIS — F32A Depression, unspecified: Secondary | ICD-10-CM | POA: Diagnosis not present

## 2020-11-15 DIAGNOSIS — Z3A00 Weeks of gestation of pregnancy not specified: Secondary | ICD-10-CM | POA: Insufficient documentation

## 2020-11-15 DIAGNOSIS — R102 Pelvic and perineal pain: Secondary | ICD-10-CM | POA: Insufficient documentation

## 2020-11-15 DIAGNOSIS — Z3A33 33 weeks gestation of pregnancy: Secondary | ICD-10-CM

## 2020-11-15 DIAGNOSIS — O26892 Other specified pregnancy related conditions, second trimester: Secondary | ICD-10-CM | POA: Insufficient documentation

## 2020-11-15 DIAGNOSIS — O321XX0 Maternal care for breech presentation, not applicable or unspecified: Secondary | ICD-10-CM

## 2020-11-15 DIAGNOSIS — R252 Cramp and spasm: Secondary | ICD-10-CM

## 2020-11-15 DIAGNOSIS — Z6791 Unspecified blood type, Rh negative: Secondary | ICD-10-CM | POA: Diagnosis not present

## 2020-11-15 DIAGNOSIS — M6281 Muscle weakness (generalized): Secondary | ICD-10-CM | POA: Insufficient documentation

## 2020-11-15 DIAGNOSIS — Z8759 Personal history of other complications of pregnancy, childbirth and the puerperium: Secondary | ICD-10-CM | POA: Diagnosis not present

## 2020-11-15 DIAGNOSIS — O3403 Maternal care for unspecified congenital malformation of uterus, third trimester: Secondary | ICD-10-CM | POA: Diagnosis not present

## 2020-11-15 DIAGNOSIS — Z362 Encounter for other antenatal screening follow-up: Secondary | ICD-10-CM

## 2020-11-15 DIAGNOSIS — O99340 Other mental disorders complicating pregnancy, unspecified trimester: Secondary | ICD-10-CM | POA: Insufficient documentation

## 2020-11-15 DIAGNOSIS — Q513 Bicornate uterus: Secondary | ICD-10-CM

## 2020-11-15 DIAGNOSIS — O09293 Supervision of pregnancy with other poor reproductive or obstetric history, third trimester: Secondary | ICD-10-CM

## 2020-11-15 NOTE — Therapy (Signed)
[facility] at [facility] [address] [place], [place], [address] Phone: [phone]   Fax:  [phone]  Physical Therapy Treatment  Patient Details  Name: Allison Oconnell MRN: [id number] Date of Birth: December 26, 1990 Referring Provider (PT): Sharen Counter,  CNM   Encounter Date: 11/15/2020   PT End of Session - 11/15/20 1256    Visit Number 5    Date for PT Re-Evaluation 01/03/21    PT Start Time 1210    PT Stop Time 1255    PT Time Calculation (min) 45 min    Activity Tolerance Patient tolerated treatment well;No increased pain    Behavior During Therapy WFL for tasks assessed/performed           Past Medical History:  Diagnosis Date  . Bicornuate uterus   . Depression   . Obesity     Past Surgical History:  Procedure Laterality Date  . GALLBLADDER SURGERY     may 2009    There were no vitals filed for this visit.   Subjective Assessment - 11/15/20 1213    Subjective My son is breech. I am feeling better than the beginning of the pregnancy. My hip pain is not as sharp. I can sleep through the night better.    Patient Stated Goals reduce pain    Currently in Pain? Yes    Pain Score 5     Pain Location Pelvis    Pain Orientation Right;Left;Mid    Pain Descriptors / Indicators Pins and needles;Sore;Tender    Pain Type Chronic pain    Pain Onset More than a month ago    Pain Frequency Constant    Aggravating Factors  laying on side, walking ,    Pain Relieving Factors stretch, lay down, limit activity    Multiple Pain Sites Yes    Pain Score 5    Pain Location Hip    Pain Orientation Right;Left    Pain Descriptors / Indicators Sore;Pressure    Pain Type Acute pain    Pain Onset More than a month ago    Pain Frequency Intermittent    Aggravating Factors  laying on them    Pain Relieving Factors changing positions, rocking when sitting                             [facility] Adult PT  Treatment/Exercise - 11/15/20 0001      Exercises   Exercises Other Exercises    Other Exercises  sit on the physioball with circles, pelvic tilt, figure 8,      Manual Therapy   Manual Therapy Soft tissue mobilization    Soft tissue mobilization using the addaday to bilateral gluteal, ITB, hamstring, around the greater trochanter, and lateral quadricep; manual work around the thoracic lumbar junction and intercoastals for the lower rib cage                    PT Short Term Goals - 10/25/20 1354      PT SHORT TERM GOAL #1   Title independent with initial HEP    Time 4    Period Weeks    Status Achieved      PT SHORT TERM GOAL #2   Title understand how to use pillows to support her back, and abdomen to reduce strain on the pelvis    Time 4    Period Weeks    Status Achieved  PT SHORT TERM GOAL #3   Title able to perfrom pelvic drop to elongate the pelvic floor    Time 4    Period Weeks    Status Achieved      PT SHORT TERM GOAL #4   Title understand ways to move in bed and during daily activities to reduce strain on the pelvis    Time 4    Period Weeks    Status Achieved             PT Long Term Goals - 11/15/20 1315      PT LONG TERM GOAL #1   Title independent with advanced HEP and how to perform perineal massage to reduce chance of tearing    Time 12    Period Weeks    Status On-going      PT LONG TERM GOAL #2   Title able to have penile penetration with pain decreased >/= 50% afterwards    Time 12    Period Weeks      PT LONG TERM GOAL #3   Title pain with daily activities decreased </= 4/10 due to improved elongation of the pelvic muscles and understanding ways to manage her pain.    Baseline pelvic pain 5/10    Time 12    Period Weeks    Status On-going      PT LONG TERM GOAL #4   Title understand ways to give birth with different positions to reduce the pelvic pain and transition the baby into the canal    Time 12    Period Weeks     Status On-going                 Plan - 11/15/20 1309    Clinical Impression Statement Patient is using the SI belt and it is helping. Patient is having less pain in her pelvis and hips. She is using her massage tools for her hips and does all of her exericses to manage her pelvis and back pain. Patient had increased tissue tightness in the thoracolumbar area and lower rib cage intercoastals. Patient back became tight while she was laying down for her ultrasound. Patient pelvis in correct alignment. Her baby is breech at this time. Patient had increased pain in the pelvic area and could be due to her positioning. Patient will benefit from skilled therapy to improve mobility, reduce muscle tension, reduce pain and improve overall function during pregnancy.    Personal Factors and Comorbidities Comorbidity 1;Time since onset of injury/illness/exacerbation;Fitness    Comorbidities pregnant    Examination-Activity Limitations Bed Mobility;Bend;Carry;Continence;Lift;Caring for Others    Examination-Participation Restrictions Interpersonal Relationship;Meal Prep    Stability/Clinical Decision Making Evolving/Moderate complexity    Rehab Potential Excellent    PT Frequency 1x / week    PT Duration 12 weeks    PT Treatment/Interventions ADLs/Self Care Home Management;Moist Heat;Cryotherapy;Therapeutic activities;Therapeutic exercise;Neuromuscular re-education;Patient/family education;Manual techniques;Passive range of motion;Spinal Manipulations    PT Next Visit Plan pullbacks in sidely and quadruped, gluteal and hamstring strenght with knee flexion sitting, REview birthing process, perineal massage    PT Home Exercise Plan Access Code: [email or web address]    Consulted and Agree with Plan of Care Patient           Patient will benefit from skilled therapeutic intervention in order to improve the following deficits and impairments:  Decreased endurance,Decreased activity tolerance,Decreased  strength,Increased fascial restricitons,Pain,Increased muscle spasms,Decreased coordination  Visit Diagnosis: Muscle weakness (generalized)  Cramp and spasm  Other lack  of coordination     Problem List Patient Active Problem List   Diagnosis Date Noted  . Pelvic pain affecting pregnancy in second trimester, antepartum 09/20/2020  . History of IUFD 06/16/2020  . Rh negative state in antepartum period 06/16/2020  . Depression affecting pregnancy, antepartum 06/16/2020  . Supervision of normal pregnancy 06/09/2020  . Contact dermatitis 12/29/2019  . Class 2 obesity due to excess calories without serious comorbidity with body mass index (BMI) of 39.0 to 39.9 in adult 03/04/2017    Eulis Foster, PT 11/15/20 1:17 PM   [place] [facility] [address] [place], [place], [address] Phone: [phone]   Fax:  [phone]  Name: Allison Oconnell MRN: [id number] Date of Birth: 1991-07-11

## 2020-11-17 ENCOUNTER — Encounter: Payer: BC Managed Care – PPO | Admitting: Obstetrics and Gynecology

## 2020-11-21 ENCOUNTER — Other Ambulatory Visit: Payer: Self-pay

## 2020-11-21 ENCOUNTER — Ambulatory Visit (INDEPENDENT_AMBULATORY_CARE_PROVIDER_SITE_OTHER): Payer: BC Managed Care – PPO | Admitting: Obstetrics & Gynecology

## 2020-11-21 VITALS — BP 106/61 | HR 89 | Wt 220.0 lb

## 2020-11-21 DIAGNOSIS — Q513 Bicornate uterus: Secondary | ICD-10-CM

## 2020-11-21 DIAGNOSIS — O3400 Maternal care for unspecified congenital malformation of uterus, unspecified trimester: Secondary | ICD-10-CM | POA: Insufficient documentation

## 2020-11-21 DIAGNOSIS — O321XX0 Maternal care for breech presentation, not applicable or unspecified: Secondary | ICD-10-CM

## 2020-11-21 DIAGNOSIS — Z8759 Personal history of other complications of pregnancy, childbirth and the puerperium: Secondary | ICD-10-CM

## 2020-11-21 DIAGNOSIS — Z3483 Encounter for supervision of other normal pregnancy, third trimester: Secondary | ICD-10-CM

## 2020-11-21 NOTE — Progress Notes (Signed)
   PRENATAL VISIT NOTE  Subjective:  Allison Oconnell is a 30 y.o. [id number] at [redacted]w[redacted]d being seen today for ongoing prenatal care.  She is currently monitored for the following issues for this high-risk pregnancy and has Class 2 obesity due to excess calories without serious comorbidity with body mass index (BMI) of 39.0 to 39.9 in adult; Contact dermatitis; Supervision of normal pregnancy; History of IUFD; Rh negative state in antepartum period; Depression affecting pregnancy, antepartum; Pelvic pain affecting pregnancy in second trimester, antepartum; and Bicornuate uterus affecting pregnancy, antepartum on their problem list.  Patient reports pelvic pressure.   .  .   . Denies leaking of fluid.   The following portions of the patient's history were reviewed and updated as appropriate: allergies, current medications, past family history, past medical history, past social history, past surgical history and problem list.   Objective:  There were no vitals filed for this visit.  Fetal Status:           General:  Alert, oriented and cooperative. Patient is in no acute distress.  Skin: Skin is warm and dry. No rash noted.   Cardiovascular: Normal heart rate noted  Respiratory: Normal respiratory effort, no problems with respiration noted  Abdomen: Soft, gravid, appropriate for gestational age.        Pelvic: Cervical exam performed in the presence of a chaperone      barely 1 cm/thick/very high  Extremities: Normal range of motion.     Mental Status: Normal mood and affect. Normal behavior. Normal judgment and thought content.   Assessment and Plan:  Pregnancy: [id number] at [redacted]w[redacted]d 1. Encounter for supervision of other normal pregnancy in third trimester  2. Bicornuate uterus affecting pregnancy, antepartum Breech at last [place]; will check at 36 weeks.  Pt has chiropractor who is going to try some therapies to encourage vertex presentation.   3. History of IUFD Most likely due to coxsackie virus;  followed by MFM and released after last [place]  Preterm labor symptoms and general obstetric precautions including but not limited to vaginal bleeding, contractions, leaking of fluid and fetal movement were reviewed in detail with the patient. Please refer to After Visit Summary for other counseling recommendations.   No follow-ups on file.  Future Appointments  Date Time Provider Department Center  11/21/2020 11:20 AM Lesly Dukes, MD [facility]  11/22/2020 11:30 AM Theressa Millard, PT [facility]  12/05/2020  9:45 AM Conan Bowens, MD [facility]  12/12/2020 10:35 AM Penne Lash, Fredrich Romans, MD [facility]    Elsie Lincoln, MD

## 2020-11-21 NOTE — Progress Notes (Signed)
Increased vaginal discharge/mucus plug??

## 2020-11-22 ENCOUNTER — Encounter: Payer: Self-pay | Admitting: Physical Therapy

## 2020-11-22 ENCOUNTER — Other Ambulatory Visit: Payer: Self-pay

## 2020-11-22 ENCOUNTER — Encounter: Payer: BC Managed Care – PPO | Admitting: Physical Therapy

## 2020-11-22 DIAGNOSIS — O26892 Other specified pregnancy related conditions, second trimester: Secondary | ICD-10-CM | POA: Diagnosis not present

## 2020-11-22 DIAGNOSIS — M6281 Muscle weakness (generalized): Secondary | ICD-10-CM

## 2020-11-22 DIAGNOSIS — R278 Other lack of coordination: Secondary | ICD-10-CM | POA: Diagnosis not present

## 2020-11-22 DIAGNOSIS — Z3A00 Weeks of gestation of pregnancy not specified: Secondary | ICD-10-CM | POA: Diagnosis not present

## 2020-11-22 DIAGNOSIS — R102 Pelvic and perineal pain: Secondary | ICD-10-CM | POA: Diagnosis not present

## 2020-11-22 DIAGNOSIS — R252 Cramp and spasm: Secondary | ICD-10-CM

## 2020-11-22 NOTE — Therapy (Addendum)
[place] at [facility] [address], [place], [place], [address] Phone: [phone]   Fax:  [phone]  Physical Therapy Treatment  Patient Details  Name: Allison Oconnell MRN: [id number] Date of Birth: [birth year] Referring Provider (PT): Fatima Blank,  CNM   Encounter Date: 11/22/2020   PT End of Session - 11/22/20 1146     Visit Number 6    Date for PT Re-Evaluation 01/03/21    Authorization Type BC[employer]    PT Start Time 8416   came late   PT Stop Time 1223    PT Time Calculation (min) 38 min    Activity Tolerance Patient tolerated treatment well;No increased pain    Behavior During Therapy [facility] for tasks assessed/performed             Past Medical History:  Diagnosis Date   Bicornuate uterus    Depression    Obesity     Past Surgical History:  Procedure Laterality Date   GALLBLADDER SURGERY     may 2009    There were no vitals filed for this visit.   Subjective Assessment - 11/22/20 1147     Subjective I feel okay. I have more pain than usual and I am dilated to 1 cm. I am having discharge. Baby is still Breech. The chiroprator will assist with the movement. The SI belt helps some of the pressure.    Patient Stated Goals reduce pain    Currently in Pain? Yes    Pain Score 6     Pain Location Pelvis    Pain Orientation Right;Left;Mid    Pain Descriptors / Indicators Pressure    Pain Type Chronic pain    Pain Onset More than a month ago    Pain Frequency Constant    Aggravating Factors  laying on side, walking    Pain Relieving Factors stretch, lay down, limit activity    Multiple Pain Sites No                [facility] PT Assessment - 11/22/20 0001       Assessment   Medical Diagnosis O26.892, R10.2 Pelvicpain affecting pregnancy in second trimester, antepartum    Referring Provider (PT) Fatima Blank,  CNM    Onset Date/Surgical Date --   constant and stared 4 years ago   Prior Therapy  none      Precautions   Precautions Other (comment)    Precaution Comments pregnant      Restrictions   Weight Bearing Restrictions No      Home Environment   Living Environment Private residence      Prior Function   Level of Independence Independent    Leisure hard to exercise due to back      Cognition   Overall Cognitive Status Within Functional Limits for tasks assessed      Posture/Postural Control   Posture/Postural Control Postural limitations    Postural Limitations Increased lumbar lordosis   reduced lumbar lordosis since initial eval   Posture Comments pregnant posture      ROM / Strength   AROM / PROM / Strength PROM;AROM;Strength      AROM   Lumbar Flexion limited due to pregnancy and did not due due to so far long in the pregnancy    Lumbar - Right Side Bend full      Strength   Right Hip ABduction 5/5    Right Hip ADduction 5/5    Left Hip ABduction  5/5    Left Hip ADduction 5/5      Palpation   SI assessment  pelvis in correct alignment    Palpation comment no tendenress located in iliopsosas                           [facility] Adult PT Treatment/Exercise - 11/22/20 0001       Lumbar Exercises: Sidelying   Other Sidelying Lumbar Exercises lay on side with trunk rotation to imporve thoracic and lower rib mobility    Other Sidelying Lumbar Exercises pull backs with roll between knees 15x each side keeping spinal neutral      Lumbar Exercises: Quadruped   Other Quadruped Lumbar Exercises pull backs wto improve SI stability 15x each side and tactile cues to not arch back      Manual Therapy   Manual Therapy Soft tissue mobilization    Soft tissue mobilization using the addaday to bilateral gluteal, ITB, hamstring, around the greater trochanter, and lateral quadricep; manual work around the thoracic lumbar junction and intercoastals for the lower rib cage                    PT Education - 11/22/20 1235     Education Details  verbally reviewed patient HEP and she feel good about it    Person(s) Educated Patient    Methods Explanation    Comprehension Verbalized understanding              PT Short Term Goals - 10/25/20 1354       PT SHORT TERM GOAL #1   Title independent with initial HEP    Time 4    Period Weeks    Status Achieved      PT SHORT TERM GOAL #2   Title understand how to use pillows to support her back, and abdomen to reduce strain on the pelvis    Time 4    Period Weeks    Status Achieved      PT SHORT TERM GOAL #3   Title able to perfrom pelvic drop to elongate the pelvic floor    Time 4    Period Weeks    Status Achieved      PT SHORT TERM GOAL #4   Title understand ways to move in bed and during daily activities to reduce strain on the pelvis    Time 4    Period Weeks    Status Achieved               PT Long Term Goals - 11/22/20 1236       PT LONG TERM GOAL #1   Title independent with advanced HEP and how to perform perineal massage to reduce chance of tearing    Time 12    Period Weeks    Status Achieved      PT LONG TERM GOAL #2   Title able to have penile penetration with pain decreased >/= 50% afterwards    Time 12    Period Weeks    Status Achieved      PT LONG TERM GOAL #3   Title pain with daily activities decreased </= 4/10 due to improved elongation of the pelvic muscles and understanding ways to manage her pain.    Time 12    Period Weeks    Status On-going      PT LONG TERM GOAL #4   Title understand ways to  give birth with different positions to reduce the pelvic pain and transition the baby into the canal    Time 12    Period Weeks    Status Achieved                    Patient will benefit from skilled therapeutic intervention in order to improve the following deficits and impairments:     Visit Diagnosis: Muscle weakness (generalized)  Cramp and spasm  Other lack of coordination     Problem List Patient Active  Problem List   Diagnosis Date Noted   Bicornuate uterus affecting pregnancy, antepartum 11/21/2020   Pelvic pain affecting pregnancy in second trimester, antepartum 09/20/2020   History of IUFD 06/16/2020   Rh negative state in antepartum period 06/16/2020   Depression affecting pregnancy, antepartum 06/16/2020   Supervision of normal pregnancy 06/09/2020   Contact dermatitis 12/29/2019   Class 2 obesity due to excess calories without serious comorbidity with body mass index (BMI) of 39.0 to 39.9 in adult 03/04/2017    Earlie Counts, PT 11/22/20 12:38 PM   [place] at [facility] [address] [place], [place], [address] Phone: [phone]   Fax:  [phone]  Name: Bernard Donahoo MRN: [id number] Date of Birth: 1991-06-21  PHYSICAL THERAPY DISCHARGE SUMMARY  Visits from Start of Care: 6  Current functional level related to goals / functional outcomes: See above.    Remaining deficits: See above.    Education / Equipment: HEP Plan: Patient agrees to discharge.  Patient goals were met. Patient is being discharged due to meeting the stated rehab goals. Thank you for the referral. Earlie Counts, PT 11/22/20 12:39 PM

## 2020-12-01 ENCOUNTER — Encounter: Payer: BC Managed Care – PPO | Admitting: Obstetrics and Gynecology

## 2020-12-05 ENCOUNTER — Ambulatory Visit (INDEPENDENT_AMBULATORY_CARE_PROVIDER_SITE_OTHER): Payer: BC Managed Care – PPO | Admitting: Obstetrics and Gynecology

## 2020-12-05 ENCOUNTER — Telehealth (HOSPITAL_COMMUNITY): Payer: Self-pay | Admitting: *Deleted

## 2020-12-05 ENCOUNTER — Encounter: Payer: Self-pay | Admitting: Obstetrics and Gynecology

## 2020-12-05 ENCOUNTER — Other Ambulatory Visit (HOSPITAL_COMMUNITY)
Admission: RE | Admit: 2020-12-05 | Discharge: 2020-12-05 | Disposition: A | Discharge status: Home / Self Care | Payer: BC Managed Care – PPO | Source: Ambulatory Visit | Attending: Obstetrics and Gynecology | Admitting: Obstetrics and Gynecology

## 2020-12-05 ENCOUNTER — Other Ambulatory Visit: Payer: Self-pay

## 2020-12-05 VITALS — BP 102/67 | HR 98 | Wt 221.0 lb

## 2020-12-05 DIAGNOSIS — Z8759 Personal history of other complications of pregnancy, childbirth and the puerperium: Secondary | ICD-10-CM

## 2020-12-05 DIAGNOSIS — Z3480 Encounter for supervision of other normal pregnancy, unspecified trimester: Secondary | ICD-10-CM

## 2020-12-05 DIAGNOSIS — O99340 Other mental disorders complicating pregnancy, unspecified trimester: Secondary | ICD-10-CM

## 2020-12-05 DIAGNOSIS — O321XX0 Maternal care for breech presentation, not applicable or unspecified: Secondary | ICD-10-CM

## 2020-12-05 DIAGNOSIS — Z6791 Unspecified blood type, Rh negative: Secondary | ICD-10-CM

## 2020-12-05 DIAGNOSIS — Z3A36 36 weeks gestation of pregnancy: Secondary | ICD-10-CM

## 2020-12-05 DIAGNOSIS — Q513 Bicornate uterus: Secondary | ICD-10-CM

## 2020-12-05 DIAGNOSIS — O26899 Other specified pregnancy related conditions, unspecified trimester: Secondary | ICD-10-CM

## 2020-12-05 DIAGNOSIS — O3400 Maternal care for unspecified congenital malformation of uterus, unspecified trimester: Secondary | ICD-10-CM

## 2020-12-05 DIAGNOSIS — F32A Depression, unspecified: Secondary | ICD-10-CM

## 2020-12-05 MED ORDER — BREAST PUMP MISC: 0 refills | Status: DC

## 2020-12-05 NOTE — Progress Notes (Signed)
   PRENATAL VISIT NOTE  Subjective:  Allison Oconnell is a 30 y.o. [id number] at [redacted]w[redacted]d being seen today for ongoing prenatal care.  She is currently monitored for the following issues for this high-risk pregnancy and has Class 2 obesity due to excess calories without serious comorbidity with body mass index (BMI) of 39.0 to 39.9 in adult; Contact dermatitis; Supervision of normal pregnancy; History of IUFD; Rh negative state in antepartum period; Depression affecting pregnancy, antepartum; Pelvic pain affecting pregnancy in second trimester, antepartum; and Bicornuate uterus affecting pregnancy, antepartum on their problem list.  Patient reports cramping.  Contractions: Not present. Vag. Bleeding: None.  Movement: Present. Denies leaking of fluid.   The following portions of the patient's history were reviewed and updated as appropriate: allergies, current medications, past family history, past medical history, past social history, past surgical history and problem list.   Objective:   Vitals:   12/05/20 0940  BP: 102/67  Pulse: 98  Weight: 221 lb (100.2 kg)    Fetal Status: Fetal Heart Rate (bpm): 134   Movement: Present     General:  Alert, oriented and cooperative. Patient is in no acute distress.  Skin: Skin is warm and dry. No rash noted.   Cardiovascular: Normal heart rate noted  Respiratory: Normal respiratory effort, no problems with respiration noted  Abdomen: Soft, gravid, appropriate for gestational age.  Pain/Pressure: Present     Pelvic: Cervical exam performed in the presence of a chaperone Dilation: Fingertip   Station: -3  Extremities: Normal range of motion.  Edema: None  Mental Status: Normal mood and affect. Normal behavior. Normal judgment and thought content.   Assessment and Plan:  Pregnancy: G5P2102 at [redacted]w[redacted]d 1. Supervision of other normal pregnancy, antepartum - Culture, beta strep (group b only) - Cervicovaginal ancillary only( [place])  2. [redacted] weeks gestation  of pregnancy  3. Bicornuate uterus affecting pregnancy, antepartum  4. History of IUFD At 64  Weeks dues to coxsackie  5. Rh negative state in antepartum period  6. Depression affecting pregnancy, antepartum Feels well  7. Breech presentation, single or unspecified fetus Counseled regarding ECV, risks/benefits, possible CS She would like to attempt ECV, scheduled for 37 weeks   Preterm labor symptoms and general obstetric precautions including but not limited to vaginal bleeding, contractions, leaking of fluid and fetal movement were reviewed in detail with the patient. Please refer to After Visit Summary for other counseling recommendations.   Return in about 1 week (around 12/12/2020) for high OB, in person.  Future Appointments  Date Time Provider Department Center  12/12/2020 10:35 AM Penne Lash, Fredrich Romans, MD [facility]    Conan Bowens, MD

## 2020-12-05 NOTE — Telephone Encounter (Signed)
Preadmission screen  

## 2020-12-06 ENCOUNTER — Encounter (HOSPITAL_COMMUNITY): Payer: Self-pay | Admitting: *Deleted

## 2020-12-06 ENCOUNTER — Telehealth (HOSPITAL_COMMUNITY): Payer: Self-pay | Admitting: *Deleted

## 2020-12-06 LAB — CERVICOVAGINAL ANCILLARY ONLY
Chlamydia: NEGATIVE
Comment: NEGATIVE
Comment: NORMAL
Neisseria Gonorrhea: NEGATIVE

## 2020-12-06 NOTE — Telephone Encounter (Signed)
Preadmission screen  

## 2020-12-07 ENCOUNTER — Inpatient Hospital Stay (HOSPITAL_COMMUNITY): Admission: RE | Admit: 2020-12-07 | Payer: BC Managed Care – PPO | Source: Ambulatory Visit

## 2020-12-07 ENCOUNTER — Other Ambulatory Visit: Payer: Self-pay | Admitting: Advanced Practice Midwife

## 2020-12-07 ENCOUNTER — Telehealth: Payer: Self-pay

## 2020-12-07 NOTE — Telephone Encounter (Signed)
Pt called stating she forgot to go get her COVID test today before hospital admission on Friday 2/25. I spoke with Demetrius Revel who handles pre procedure covid testing and she recommends pt go to get covid test at 8am tomorrow in order to have the results in time for Friday. I spoke with pt and she is aware and will go for testing at 8am in the morning.

## 2020-12-08 ENCOUNTER — Other Ambulatory Visit (HOSPITAL_COMMUNITY)
Admission: RE | Admit: 2020-12-08 | Discharge: 2020-12-08 | Disposition: A | Discharge status: Home / Self Care | Payer: BC Managed Care – PPO | Source: Ambulatory Visit | Attending: Obstetrics & Gynecology | Admitting: Obstetrics & Gynecology

## 2020-12-08 ENCOUNTER — Encounter: Payer: BC Managed Care – PPO | Admitting: Obstetrics and Gynecology

## 2020-12-08 DIAGNOSIS — Z20822 Contact with and (suspected) exposure to covid-19: Secondary | ICD-10-CM | POA: Insufficient documentation

## 2020-12-08 DIAGNOSIS — O2200 Varicose veins of lower extremity in pregnancy, unspecified trimester: Secondary | ICD-10-CM | POA: Diagnosis not present

## 2020-12-08 LAB — CULTURE, BETA STREP (GROUP B ONLY)
MICRO NUMBER:: 11561827
SPECIMEN QUALITY:: ADEQUATE

## 2020-12-08 LAB — SARS CORONAVIRUS 2 (TAT 6-24 HRS): SARS Coronavirus 2: NEGATIVE

## 2020-12-09 ENCOUNTER — Ambulatory Visit (HOSPITAL_COMMUNITY)
Admission: RE | Admit: 2020-12-09 | Discharge: 2020-12-09 | Disposition: A | Discharge status: Home / Self Care | Payer: BC Managed Care – PPO | Source: Ambulatory Visit | Attending: Obstetrics & Gynecology | Admitting: Obstetrics & Gynecology

## 2020-12-09 ENCOUNTER — Other Ambulatory Visit: Payer: Self-pay

## 2020-12-09 ENCOUNTER — Encounter (HOSPITAL_COMMUNITY): Payer: Self-pay

## 2020-12-09 DIAGNOSIS — E6609 Other obesity due to excess calories: Secondary | ICD-10-CM

## 2020-12-09 DIAGNOSIS — O3400 Maternal care for unspecified congenital malformation of uterus, unspecified trimester: Secondary | ICD-10-CM

## 2020-12-09 DIAGNOSIS — Z6791 Unspecified blood type, Rh negative: Secondary | ICD-10-CM | POA: Diagnosis not present

## 2020-12-09 DIAGNOSIS — Z3A37 37 weeks gestation of pregnancy: Secondary | ICD-10-CM | POA: Diagnosis not present

## 2020-12-09 DIAGNOSIS — Z79899 Other long term (current) drug therapy: Secondary | ICD-10-CM | POA: Diagnosis not present

## 2020-12-09 DIAGNOSIS — E66812 Obesity, class 2: Secondary | ICD-10-CM

## 2020-12-09 DIAGNOSIS — O321XX0 Maternal care for breech presentation, not applicable or unspecified: Secondary | ICD-10-CM | POA: Diagnosis not present

## 2020-12-09 DIAGNOSIS — Z7982 Long term (current) use of aspirin: Secondary | ICD-10-CM | POA: Insufficient documentation

## 2020-12-09 DIAGNOSIS — F32A Depression, unspecified: Secondary | ICD-10-CM | POA: Diagnosis present

## 2020-12-09 DIAGNOSIS — O3403 Maternal care for unspecified congenital malformation of uterus, third trimester: Secondary | ICD-10-CM | POA: Insufficient documentation

## 2020-12-09 DIAGNOSIS — Z3490 Encounter for supervision of normal pregnancy, unspecified, unspecified trimester: Secondary | ICD-10-CM

## 2020-12-09 DIAGNOSIS — O328XX0 Maternal care for other malpresentation of fetus, not applicable or unspecified: Secondary | ICD-10-CM | POA: Diagnosis not present

## 2020-12-09 DIAGNOSIS — O26899 Other specified pregnancy related conditions, unspecified trimester: Secondary | ICD-10-CM

## 2020-12-09 DIAGNOSIS — Q513 Bicornate uterus: Secondary | ICD-10-CM | POA: Diagnosis not present

## 2020-12-09 DIAGNOSIS — Z6839 Body mass index (BMI) 39.0-39.9, adult: Secondary | ICD-10-CM

## 2020-12-09 DIAGNOSIS — O329XX0 Maternal care for malpresentation of fetus, unspecified, not applicable or unspecified: Secondary | ICD-10-CM

## 2020-12-09 DIAGNOSIS — O99340 Other mental disorders complicating pregnancy, unspecified trimester: Secondary | ICD-10-CM | POA: Diagnosis present

## 2020-12-09 LAB — CBC
HCT: 38 % (ref 36.0–46.0)
Hemoglobin: 12.8 g/dL (ref 12.0–15.0)
MCH: 30.9 pg (ref 26.0–34.0)
MCHC: 33.7 g/dL (ref 30.0–36.0)
MCV: 91.8 fL (ref 80.0–100.0)
Platelets: 162 10*3/uL (ref 150–400)
RBC: 4.14 MIL/uL (ref 3.87–5.11)
RDW: 14.5 % (ref 11.5–15.5)
WBC: 12.3 10*3/uL — ABNORMAL HIGH (ref 4.0–10.5)
nRBC: 0 % (ref 0.0–0.2)

## 2020-12-09 LAB — TYPE AND SCREEN
ABO/RH(D): O NEG
Antibody Screen: NEGATIVE

## 2020-12-09 MED ORDER — TERBUTALINE SULFATE 1 MG/ML IJ SOLN
INTRAMUSCULAR | Status: AC
  Filled 2020-12-09: qty 1

## 2020-12-09 MED ORDER — LACTATED RINGERS IV SOLN: INTRAVENOUS | Status: DC

## 2020-12-09 MED ORDER — TERBUTALINE SULFATE 1 MG/ML IJ SOLN
0.2500 mg | Freq: Once | INTRAMUSCULAR | Status: AC
  Administered 2020-12-09: 0.25 mg via SUBCUTANEOUS

## 2020-12-09 NOTE — Procedures (Signed)
After informed written consent, Terbutaline 0.25 mg SQ given. FHTs 130s prior to procedure.  ECV was attempted under Ultrasound guidance.  FHR was reactive before and after the procedure.   Pt and fetus tolerated the procedure well.  Will discharge home with plan for scheduled Cesarean delivery at 39 weeks.  [name], MD 12/09/2020, 10:07 AM

## 2020-12-09 NOTE — H&P (Addendum)
OBSTETRIC ADMISSION HISTORY AND PHYSICAL  Allison Oconnell is a 30 y.o. female [phone] with IUP at [redacted]w[redacted]d by L/12 presenting for ECV given footling breech presentation. She reports +FMs, No LOF, no VB, no blurry vision, headaches or peripheral edema, and RUQ pain.  She plans on breast and bottle feeding. She is undecided for birth control.  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 Bicornuate uterus   . Depression   . Obesity     Past Surgical History: Past Surgical History:  Procedure Laterality Date  . GALLBLADDER SURGERY     may 2009    Obstetrical History: OB History    Gravida  6   Para  4   Term  2   Preterm  1   AB  1   Living  2     SAB  1   IAB      Ectopic      Multiple      Live Births  2           Social History Social History   Socioeconomic History  . Marital status: Married    Spouse name: Not on file  . Number of children: Not on file  . Years of education: Not on file  . Highest education level: Not on file  Occupational History  . Occupation: homemaker  Tobacco Use  . Smoking status: Never Smoker  . Smokeless tobacco: Never Used  Vaping Use  . Vaping Use: Never used  Substance and Sexual Activity  . Alcohol use: Never  . Drug use: Never  . Sexual activity: Yes    Birth control/protection: None  Other Topics Concern  . Not on file  Social History Narrative  . Not on file   Social Determinants of Health   Financial Resource Strain: Not on file  Food Insecurity: Not on file  Transportation Needs: Not on file  Physical Activity: Not on  file  Stress: Not on file  Social Connections: Not on file    Family History: Family History  Problem Relation Age of Onset  . Melanoma Maternal Grandmother   . Depression Mother   . Hypertension Father   . Stroke Father   . Lung cancer Paternal Grandfather   . Skin cancer Paternal Grandmother     Allergies: Allergies  Allergen Reactions  . Sulfa Antibiotics Rash    Medications Prior to Admission  Medication Sig Dispense Refill Last Dose  . aspirin EC 81 MG tablet Take 1 tablet (81 mg total) by mouth daily. Take after 12 weeks for prevention of preeclampsia later in pregnancy 300 tablet 2   . Elastic Bandages & Supports (COMFORT FIT MATERNITY SUPP MED) MISC 1 Device by Does not apply route daily. 1 each 0   . Misc. Devices (BREAST PUMP) MISC Disp 1 hospital grade breast pump to use for breast feeding.  EDC 12/29/20 1 each 0   . Prenatal Vit-Fe Fumarate-FA (PRENATAL VITAMIN PO) Take by mouth.     . sertraline (ZOLOFT) 50 MG tablet Take 50 mg by mouth daily.        Review of  Systems   All systems reviewed and negative except as stated in HPI  Last menstrual period 03/24/2020. General appearance: alert, cooperative and appears stated age Lungs: normal WOB Heart: regular rate  Abdomen: soft, non-tender Extremities:no sign of DVT Presentation: footling breech on bedside ultrasound FHT: 130s prior to ECV    Prenatal labs: ABO, Rh: O/RH(D) NEGATIVE/-- (11/09 1033) Antibody: NO ANTIBODIES DETECTED (12/21 0000) Rubella: 4.67 (10/04 0907) RPR: NON-REACTIVE (12/21 0000)  HBsAg: NON-REACTIVE (10/04 0907)  HIV: NON-REACTIVE (12/21 0000)  GBS:   negative 2 hr Glucola 71/76/62  Genetic screening  declined Anatomy US wnl  Prenatal Transfer Tool  Maternal Diabetes: No Genetic Screening: Declined Maternal Ultrasounds/Referrals: Normal except for breech presentation Fetal Ultrasounds or other Referrals:  None Maternal Substance Abuse:  No Significant Maternal Medications:   None Significant Maternal Lab Results: Group B Strep negative  No results found for this or any previous visit (from the past 24 hour(s)).  Patient Active Problem List   Diagnosis Date Noted  . Bicornuate uterus affecting pregnancy, antepartum 11/21/2020  . Pelvic pain affecting pregnancy in second trimester, antepartum 09/20/2020  . History of IUFD 06/16/2020  . Rh negative state in antepartum period 06/16/2020  . Depression affecting pregnancy, antepartum 06/16/2020  . Supervision of normal pregnancy 06/09/2020  . Contact dermatitis 12/29/2019  . Class 2 obesity due to excess calories without serious comorbidity with body mass index (BMI) of 39.0 to 39.9 in adult 03/04/2017    Assessment/Plan:  Allison Oconnell is a 30 y.o. H2D9242 at [redacted]w[redacted]d here for [facility] given fetal malpresentation.  #Breech Presentation: plan for ECV s/p informed consent.  Allison Oats, MD  12/09/2020, 9:36 AM

## 2020-12-09 NOTE — Discharge Instructions (Signed)
We will call you to schedule your Cesarean delivery. Please follow-up with your next prenatal appointment as previously scheduled or call if other concerns.  Fetal Movement Counts Patient Name: ________________________________________________ Patient Due Date: ____________________  What is a fetal movement count? A fetal movement count is the number of times that you feel your baby move during a certain amount of time. This may also be called a fetal kick count. A fetal movement count is recommended for every pregnant woman. You may be asked to start counting fetal movements as early as week 28 of your pregnancy. Pay attention to when your baby is most active. You may notice your baby's sleep and wake cycles. You may also notice things that make your baby move more. You should do a fetal movement count:  When your baby is normally most active.  At the same time each day. A good time to count movements is while you are resting, after having something to eat and drink. How do I count fetal movements? 1. Find a quiet, comfortable area. Sit, or lie down on your side. 2. Write down the date, the start time and stop time, and the number of movements that you felt between those two times. Take this information with you to your health care visits. 3. Write down your start time when you feel the first movement. 4. Count kicks, flutters, swishes, rolls, and jabs. You should feel at least 10 movements. 5. You may stop counting after you have felt 10 movements, or if you have been counting for 2 hours. Write down the stop time. 6. If you do not feel 10 movements in 2 hours, contact your health care provider for further instructions. Your health care provider may want to do additional tests to assess your baby's well-being. Contact a health care provider if:  You feel fewer than 10 movements in 2 hours.  Your baby is not moving like he or she usually does. Date: ____________ Start time: ____________ Stop  time: ____________ Movements: ____________ Date: ____________ Start time: ____________ Stop time: ____________ Movements: ____________ Date: ____________ Start time: ____________ Stop time: ____________ Movements: ____________ Date: ____________ Start time: ____________ Stop time: ____________ Movements: ____________ Date: ____________ Start time: ____________ Stop time: ____________ Movements: ____________ Date: ____________ Start time: ____________ Stop time: ____________ Movements: ____________ Date: ____________ Start time: ____________ Stop time: ____________ Movements: ____________ Date: ____________ Start time: ____________ Stop time: ____________ Movements: ____________ Date: ____________ Start time: ____________ Stop time: ____________ Movements: ____________ This information is not intended to replace advice given to you by your health care provider. Make sure you discuss any questions you have with your health care provider. Document Revised: 05/21/2019 Document Reviewed: 05/21/2019 Elsevier Patient Education  2021 [employer].

## 2020-12-12 ENCOUNTER — Ambulatory Visit (INDEPENDENT_AMBULATORY_CARE_PROVIDER_SITE_OTHER): Payer: BC Managed Care – PPO | Admitting: Obstetrics & Gynecology

## 2020-12-12 ENCOUNTER — Other Ambulatory Visit: Payer: Self-pay

## 2020-12-12 ENCOUNTER — Telehealth (HOSPITAL_COMMUNITY): Payer: Self-pay | Admitting: *Deleted

## 2020-12-12 VITALS — Wt 224.0 lb

## 2020-12-12 DIAGNOSIS — O329XX0 Maternal care for malpresentation of fetus, unspecified, not applicable or unspecified: Secondary | ICD-10-CM

## 2020-12-12 NOTE — Telephone Encounter (Signed)
Preadmission screen  

## 2020-12-12 NOTE — Progress Notes (Signed)
   PRENATAL VISIT NOTE  Subjective:  Allison Oconnell is a 30 y.o. M7E6754 at [redacted]w[redacted]d being seen today for ongoing prenatal care.  She is currently monitored for the following issues for this high-risk pregnancy and has Class 2 obesity due to excess calories without serious comorbidity with body mass index (BMI) of 39.0 to 39.9 in adult; Contact dermatitis; Supervision of normal pregnancy; History of IUFD; Rh negative state in antepartum period; Depression affecting pregnancy, antepartum; Pelvic pain affecting pregnancy in second trimester, antepartum; Bicornuate uterus affecting pregnancy, antepartum; and Malpresentation before onset of labor on their problem list.  Patient reports no complaints.  Contractions: Irritability. Vag. Bleeding: None.  Movement: Present. Denies leaking of fluid.   The following portions of the patient's history were reviewed and updated as appropriate: allergies, current medications, past family history, past medical history, past social history, past surgical history and problem list.   Objective:   Vitals:   12/12/20 1036  Weight: 224 lb (101.6 kg)    Fetal Status: Fetal Heart Rate (bpm): 143   Movement: Present     General:  Alert, oriented and cooperative. Patient is in no acute distress.  Skin: Skin is warm and dry. No rash noted.   Cardiovascular: Normal heart rate noted  Respiratory: Normal respiratory effort, no problems with respiration noted  Abdomen: Soft, gravid, appropriate for gestational age.  Pain/Pressure: Present     Pelvic: Cervical exam deferred        Extremities: Normal range of motion.  Edema: None  Mental Status: Normal mood and affect. Normal behavior. Normal judgment and thought content.   Assessment and Plan:  Pregnancy: G9E0100 at [redacted]w[redacted]d 1. Malpresentation before onset of labor, single or unspecified fetus Still breach c/s scheduled for 12/22/20  Term labor symptoms and general obstetric precautions including but not limited to vaginal  bleeding, contractions, leaking of fluid and fetal movement were reviewed in detail with the patient. Please refer to After Visit Summary for other counseling recommendations.   No follow-ups on file.  Future Appointments  Date Time Provider Department Center  12/20/2020  9:30 AM [facility] PAT 1 [facility] None  12/20/2020 10:30 AM [facility]ENING [facility] None    Elsie Lincoln, MD

## 2020-12-13 ENCOUNTER — Encounter (HOSPITAL_COMMUNITY): Payer: Self-pay

## 2020-12-13 ENCOUNTER — Other Ambulatory Visit (INDEPENDENT_AMBULATORY_CARE_PROVIDER_SITE_OTHER): Payer: BC Managed Care – PPO | Admitting: Obstetrics and Gynecology

## 2020-12-13 DIAGNOSIS — O329XX0 Maternal care for malpresentation of fetus, unspecified, not applicable or unspecified: Secondary | ICD-10-CM | POA: Insufficient documentation

## 2020-12-13 DIAGNOSIS — O321XX0 Maternal care for breech presentation, not applicable or unspecified: Secondary | ICD-10-CM

## 2020-12-13 MED ORDER — CEFAZOLIN SODIUM-DEXTROSE 2-4 GM/100ML-% IV SOLN: 2.0000 g | INTRAVENOUS | Status: DC

## 2020-12-13 MED ORDER — SOD CITRATE-CITRIC ACID 500-334 MG/5ML PO SOLN: 30.0000 mL | ORAL | Status: DC

## 2020-12-13 NOTE — Patient Instructions (Signed)
Allison Oconnell  12/13/2020   Your procedure is scheduled on:  12/22/2020  Arrive at 0730 at [employer] [facility] on [address] at [facility]  and [employer]. You are invited to use the FREE valet parking or use the Visitor's parking deck.  Pick up the phone at the desk and dial [phone].  Call this number if you have problems the morning of surgery: [phone]  Remember:   Do not eat food:(After Midnight) Desps de medianoche.  Do not drink clear liquids: (After Midnight) Desps de medianoche.  Take these medicines the morning of surgery with A SIP OF WATER:  Take zoloft as prescribed   Do not wear jewelry, make-up or nail polish.  Do not wear lotions, powders, or perfumes. Do not wear deodorant.  Do not shave 48 hours prior to surgery.  Do not bring valuables to the hospital.  [facility] is not   responsible for any belongings or valuables brought to the hospital.  Contacts, dentures or bridgework may not be worn into surgery.  Leave suitcase in the car. After surgery it may be brought to your room.  For patients admitted to the hospital, checkout time is 11:00 AM the day of              discharge.      Please read over the following fact sheets that you were given:     Preparing for Surgery

## 2020-12-13 NOTE — Progress Notes (Signed)
C/s orders placed

## 2020-12-17 ENCOUNTER — Encounter (HOSPITAL_COMMUNITY): Payer: Self-pay | Admitting: Anesthesiology

## 2020-12-19 ENCOUNTER — Encounter: Payer: Self-pay | Admitting: Obstetrics and Gynecology

## 2020-12-19 ENCOUNTER — Ambulatory Visit (INDEPENDENT_AMBULATORY_CARE_PROVIDER_SITE_OTHER): Payer: BC Managed Care – PPO | Admitting: Obstetrics and Gynecology

## 2020-12-19 ENCOUNTER — Other Ambulatory Visit: Payer: Self-pay

## 2020-12-19 VITALS — BP 109/73 | HR 100 | Wt 224.0 lb

## 2020-12-19 DIAGNOSIS — O3400 Maternal care for unspecified congenital malformation of uterus, unspecified trimester: Secondary | ICD-10-CM

## 2020-12-19 DIAGNOSIS — Z3483 Encounter for supervision of other normal pregnancy, third trimester: Secondary | ICD-10-CM

## 2020-12-19 DIAGNOSIS — F32A Depression, unspecified: Secondary | ICD-10-CM

## 2020-12-19 DIAGNOSIS — O99340 Other mental disorders complicating pregnancy, unspecified trimester: Secondary | ICD-10-CM

## 2020-12-19 DIAGNOSIS — Q513 Bicornate uterus: Secondary | ICD-10-CM

## 2020-12-19 DIAGNOSIS — O329XX0 Maternal care for malpresentation of fetus, unspecified, not applicable or unspecified: Secondary | ICD-10-CM

## 2020-12-19 DIAGNOSIS — Z8759 Personal history of other complications of pregnancy, childbirth and the puerperium: Secondary | ICD-10-CM

## 2020-12-19 DIAGNOSIS — O26899 Other specified pregnancy related conditions, unspecified trimester: Secondary | ICD-10-CM

## 2020-12-19 DIAGNOSIS — Z3A38 38 weeks gestation of pregnancy: Secondary | ICD-10-CM

## 2020-12-19 DIAGNOSIS — Z6791 Unspecified blood type, Rh negative: Secondary | ICD-10-CM

## 2020-12-19 NOTE — Progress Notes (Signed)
   PRENATAL VISIT NOTE  Subjective:  Allison Oconnell is a 30 y.o. [id number] at [redacted]w[redacted]d being seen today for ongoing prenatal care.  She is currently monitored for the following issues for this high-risk pregnancy and has Class 2 obesity due to excess calories without serious comorbidity with body mass index (BMI) of 39.0 to 39.9 in adult; Contact dermatitis; Supervision of normal pregnancy; History of IUFD; Rh negative state in antepartum period; Depression affecting pregnancy, antepartum; Pelvic pain affecting pregnancy in second trimester, antepartum; Bicornuate uterus affecting pregnancy, antepartum; Malpresentation before onset of labor; and Fetal malpresentation on their problem list.  Patient reports no complaints.  Contractions: Irritability. Vag. Bleeding: None.  Movement: Present. Denies leaking of fluid.   The following portions of the patient's history were reviewed and updated as appropriate: allergies, current medications, past family history, past medical history, past social history, past surgical history and problem list.   Objective:   Vitals:   12/19/20 1053  BP: 109/73  Pulse: 100  Weight: 224 lb (101.6 kg)    Fetal Status: Fetal Heart Rate (bpm): 135   Movement: Present     General:  Alert, oriented and cooperative. Patient is in no acute distress.  Skin: Skin is warm and dry. No rash noted.   Cardiovascular: Normal heart rate noted  Respiratory: Normal respiratory effort, no problems with respiration noted  Abdomen: Soft, gravid, appropriate for gestational age.  Pain/Pressure: Present     Pelvic: Cervical exam deferred        Extremities: Normal range of motion.  Edema: None  Mental Status: Normal mood and affect. Normal behavior. Normal judgment and thought content.   Assessment and Plan:  Pregnancy: [id number] at [redacted]w[redacted]d  1. Bicornuate uterus affecting pregnancy, antepartum  2. Encounter for supervision of other normal pregnancy in third trimester  3. History of  IUFD  4. Rh negative state in antepartum period  5. Depression affecting pregnancy, antepartum  6. Malpresentation before onset of labor, single or unspecified fetus S/p failed ECV Has primary CS for breech presentation scheduled for Thursday Remains breech today Reviewed hospital course, expectations  7. [redacted] weeks gestation of pregnancy  Term labor symptoms and general obstetric precautions including but not limited to vaginal bleeding, contractions, leaking of fluid and fetal movement were reviewed in detail with the patient. Please refer to After Visit Summary for other counseling recommendations.   Return in about 5 years (around 12/19/2025) for post partum check.  Future Appointments  Date Time Provider Department Center  12/20/2020  9:30 AM [facility] PAT 1 [facility] None  12/20/2020 10:30 AM [facility]ENING [facility] None    Conan Bowens, MD

## 2020-12-20 ENCOUNTER — Other Ambulatory Visit: Payer: Self-pay

## 2020-12-20 ENCOUNTER — Inpatient Hospital Stay (HOSPITAL_COMMUNITY): Payer: BC Managed Care – PPO | Admitting: Anesthesiology

## 2020-12-20 ENCOUNTER — Encounter (HOSPITAL_COMMUNITY)
Admission: AD | Disposition: A | Discharge status: Home / Self Care | Payer: Self-pay | Source: Home / Self Care | Attending: Family Medicine

## 2020-12-20 ENCOUNTER — Other Ambulatory Visit (HOSPITAL_COMMUNITY): Payer: BC Managed Care – PPO

## 2020-12-20 ENCOUNTER — Inpatient Hospital Stay (HOSPITAL_COMMUNITY)
Admission: AD | Admit: 2020-12-20 | Discharge: 2020-12-22 | DRG: 788 | Disposition: A | Discharge status: Home / Self Care | Payer: BC Managed Care – PPO | Attending: Family Medicine | Admitting: Family Medicine

## 2020-12-20 ENCOUNTER — Encounter (HOSPITAL_COMMUNITY)
Admission: RE | Admit: 2020-12-20 | Discharge: 2020-12-20 | Disposition: A | Discharge status: Home / Self Care | Payer: BC Managed Care – PPO | Source: Ambulatory Visit | Attending: Family Medicine | Admitting: Family Medicine

## 2020-12-20 ENCOUNTER — Encounter (HOSPITAL_COMMUNITY): Payer: Self-pay | Admitting: Obstetrics & Gynecology

## 2020-12-20 DIAGNOSIS — O3400 Maternal care for unspecified congenital malformation of uterus, unspecified trimester: Secondary | ICD-10-CM

## 2020-12-20 DIAGNOSIS — O321XX0 Maternal care for breech presentation, not applicable or unspecified: Secondary | ICD-10-CM | POA: Diagnosis not present

## 2020-12-20 DIAGNOSIS — Q513 Bicornate uterus: Secondary | ICD-10-CM | POA: Diagnosis not present

## 2020-12-20 DIAGNOSIS — Z3A00 Weeks of gestation of pregnancy not specified: Secondary | ICD-10-CM | POA: Diagnosis not present

## 2020-12-20 DIAGNOSIS — Z20822 Contact with and (suspected) exposure to covid-19: Secondary | ICD-10-CM | POA: Diagnosis not present

## 2020-12-20 DIAGNOSIS — O329XX0 Maternal care for malpresentation of fetus, unspecified, not applicable or unspecified: Secondary | ICD-10-CM | POA: Diagnosis present

## 2020-12-20 DIAGNOSIS — O4202 Full-term premature rupture of membranes, onset of labor within 24 hours of rupture: Secondary | ICD-10-CM | POA: Diagnosis not present

## 2020-12-20 DIAGNOSIS — Z3A38 38 weeks gestation of pregnancy: Secondary | ICD-10-CM | POA: Diagnosis not present

## 2020-12-20 DIAGNOSIS — O3403 Maternal care for unspecified congenital malformation of uterus, third trimester: Secondary | ICD-10-CM | POA: Diagnosis present

## 2020-12-20 DIAGNOSIS — Z98891 History of uterine scar from previous surgery: Secondary | ICD-10-CM

## 2020-12-20 DIAGNOSIS — O26893 Other specified pregnancy related conditions, third trimester: Secondary | ICD-10-CM | POA: Diagnosis not present

## 2020-12-20 DIAGNOSIS — O99214 Obesity complicating childbirth: Secondary | ICD-10-CM | POA: Diagnosis present

## 2020-12-20 DIAGNOSIS — O26899 Other specified pregnancy related conditions, unspecified trimester: Secondary | ICD-10-CM

## 2020-12-20 DIAGNOSIS — Z6791 Unspecified blood type, Rh negative: Secondary | ICD-10-CM

## 2020-12-20 LAB — RESP PANEL BY RT-PCR (FLU A&B, COVID) ARPGX2
Influenza A by PCR: NEGATIVE
Influenza B by PCR: NEGATIVE
SARS Coronavirus 2 by RT PCR: NEGATIVE

## 2020-12-20 LAB — TYPE AND SCREEN
ABO/RH(D): O NEG
Antibody Screen: NEGATIVE

## 2020-12-20 LAB — CBC
HCT: 38.1 % (ref 36.0–46.0)
Hemoglobin: 13 g/dL (ref 12.0–15.0)
MCH: 30.8 pg (ref 26.0–34.0)
MCHC: 34.1 g/dL (ref 30.0–36.0)
MCV: 90.3 fL (ref 80.0–100.0)
Platelets: 150 10*3/uL (ref 150–400)
RBC: 4.22 MIL/uL (ref 3.87–5.11)
RDW: 14.6 % (ref 11.5–15.5)
WBC: 10.9 10*3/uL — ABNORMAL HIGH (ref 4.0–10.5)
nRBC: 0 % (ref 0.0–0.2)

## 2020-12-20 LAB — POCT FERN TEST: POCT Fern Test: POSITIVE

## 2020-12-20 SURGERY — Surgical Case
Anesthesia: Regional

## 2020-12-20 SURGERY — Surgical Case
Anesthesia: Spinal | Wound class: Clean Contaminated

## 2020-12-20 MED ORDER — FENTANYL CITRATE (PF) 100 MCG/2ML IJ SOLN: 25.0000 ug | INTRAMUSCULAR | Status: DC | PRN

## 2020-12-20 MED ORDER — BUPIVACAINE IN DEXTROSE 0.75-8.25 % IT SOLN
INTRATHECAL | Status: DC | PRN
  Administered 2020-12-20: 1.6 mL via INTRATHECAL

## 2020-12-20 MED ORDER — SODIUM CHLORIDE 0.9 % IR SOLN
Status: DC | PRN
  Administered 2020-12-20: 1000 mL

## 2020-12-20 MED ORDER — LACTATED RINGERS IV SOLN
125.0000 mL/h | INTRAVENOUS | Status: DC
  Administered 2020-12-20: 125 mL/h via INTRAVENOUS

## 2020-12-20 MED ORDER — MORPHINE SULFATE (PF) 0.5 MG/ML IJ SOLN
INTRAMUSCULAR | Status: DC | PRN
  Administered 2020-12-20: .15 mg via INTRATHECAL

## 2020-12-20 MED ORDER — FENTANYL CITRATE (PF) 100 MCG/2ML IJ SOLN
INTRAMUSCULAR | Status: DC | PRN
  Administered 2020-12-20: 15 ug via INTRATHECAL

## 2020-12-20 MED ORDER — OXYTOCIN-SODIUM CHLORIDE 30-0.9 UT/500ML-% IV SOLN: 2.5000 [IU]/h | INTRAVENOUS | Status: AC

## 2020-12-20 MED ORDER — DEXAMETHASONE SODIUM PHOSPHATE 4 MG/ML IJ SOLN
INTRAMUSCULAR | Status: DC | PRN
  Administered 2020-12-20: 4 mg via INTRAVENOUS

## 2020-12-20 MED ORDER — PROMETHAZINE HCL 25 MG/ML IJ SOLN: 6.2500 mg | INTRAMUSCULAR | Status: DC | PRN

## 2020-12-20 MED ORDER — KETOROLAC TROMETHAMINE 30 MG/ML IJ SOLN: 30.0000 mg | Freq: Four times a day (QID) | INTRAMUSCULAR | Status: AC | PRN

## 2020-12-20 MED ORDER — ONDANSETRON HCL 4 MG/2ML IJ SOLN
INTRAMUSCULAR | Status: DC | PRN
  Administered 2020-12-20: 4 mg via INTRAVENOUS

## 2020-12-20 MED ORDER — SIMETHICONE 80 MG PO CHEW
80.0000 mg | CHEWABLE_TABLET | Freq: Three times a day (TID) | ORAL | Status: DC
  Administered 2020-12-20 – 2020-12-22 (×5): 80 mg via ORAL
  Filled 2020-12-20 (×5): qty 1

## 2020-12-20 MED ORDER — LACTATED RINGERS IV SOLN: INTRAVENOUS | Status: DC | PRN

## 2020-12-20 MED ORDER — LACTATED RINGERS IV SOLN: INTRAVENOUS | Status: DC

## 2020-12-20 MED ORDER — SOD CITRATE-CITRIC ACID 500-334 MG/5ML PO SOLN
30.0000 mL | Freq: Once | ORAL | Status: AC
  Administered 2020-12-20: 30 mL via ORAL
  Filled 2020-12-20: qty 15

## 2020-12-20 MED ORDER — SCOPOLAMINE 1 MG/3DAYS TD PT72
1.0000 | MEDICATED_PATCH | Freq: Once | TRANSDERMAL | Status: DC
  Administered 2020-12-20: 1.5 mg via TRANSDERMAL
  Filled 2020-12-20: qty 1

## 2020-12-20 MED ORDER — OXYCODONE HCL 5 MG PO TABS: 5.0000 mg | ORAL_TABLET | Freq: Once | ORAL | Status: DC | PRN

## 2020-12-20 MED ORDER — KETOROLAC TROMETHAMINE 30 MG/ML IJ SOLN
INTRAMUSCULAR | Status: AC
  Filled 2020-12-20: qty 1

## 2020-12-20 MED ORDER — MENTHOL 3 MG MT LOZG: 1.0000 | LOZENGE | OROMUCOSAL | Status: DC | PRN

## 2020-12-20 MED ORDER — ZOLPIDEM TARTRATE 5 MG PO TABS: 5.0000 mg | ORAL_TABLET | Freq: Every evening | ORAL | Status: DC | PRN

## 2020-12-20 MED ORDER — CEFAZOLIN SODIUM-DEXTROSE 2-4 GM/100ML-% IV SOLN
2.0000 g | INTRAVENOUS | Status: AC
  Administered 2020-12-20: 2 g via INTRAVENOUS
  Filled 2020-12-20: qty 100

## 2020-12-20 MED ORDER — DIPHENHYDRAMINE HCL 25 MG PO CAPS: 25.0000 mg | ORAL_CAPSULE | Freq: Four times a day (QID) | ORAL | Status: DC | PRN

## 2020-12-20 MED ORDER — AMISULPRIDE (ANTIEMETIC) 5 MG/2ML IV SOLN
INTRAVENOUS | Status: AC
  Filled 2020-12-20: qty 4

## 2020-12-20 MED ORDER — WITCH HAZEL-GLYCERIN EX PADS: 1.0000 "application " | MEDICATED_PAD | CUTANEOUS | Status: DC | PRN

## 2020-12-20 MED ORDER — ACETAMINOPHEN 10 MG/ML IV SOLN
INTRAVENOUS | Status: AC
  Filled 2020-12-20: qty 100

## 2020-12-20 MED ORDER — OXYTOCIN-SODIUM CHLORIDE 30-0.9 UT/500ML-% IV SOLN
INTRAVENOUS | Status: DC | PRN
  Administered 2020-12-20: 30 [IU] via INTRAVENOUS

## 2020-12-20 MED ORDER — SIMETHICONE 80 MG PO CHEW: 80.0000 mg | CHEWABLE_TABLET | ORAL | Status: DC | PRN

## 2020-12-20 MED ORDER — SODIUM CHLORIDE 0.9 % IV SOLN
INTRAVENOUS | Status: DC | PRN
  Administered 2020-12-20: 500 mg via INTRAVENOUS

## 2020-12-20 MED ORDER — POVIDONE-IODINE 10 % EX SWAB
2.0000 "application " | Freq: Once | CUTANEOUS | Status: AC
  Administered 2020-12-20: 2 via TOPICAL

## 2020-12-20 MED ORDER — DIBUCAINE (PERIANAL) 1 % EX OINT: 1.0000 "application " | TOPICAL_OINTMENT | CUTANEOUS | Status: DC | PRN

## 2020-12-20 MED ORDER — PHENYLEPHRINE HCL-NACL 20-0.9 MG/250ML-% IV SOLN
INTRAVENOUS | Status: DC | PRN
  Administered 2020-12-20: 60 ug/min via INTRAVENOUS

## 2020-12-20 MED ORDER — TETANUS-DIPHTH-ACELL PERTUSSIS 5-2.5-18.5 LF-MCG/0.5 IM SUSY: 0.5000 mL | PREFILLED_SYRINGE | Freq: Once | INTRAMUSCULAR | Status: DC

## 2020-12-20 MED ORDER — COCONUT OIL OIL: 1.0000 "application " | TOPICAL_OIL | Status: DC | PRN

## 2020-12-20 MED ORDER — IBUPROFEN 800 MG PO TABS
800.0000 mg | ORAL_TABLET | Freq: Four times a day (QID) | ORAL | Status: DC
  Administered 2020-12-21 – 2020-12-22 (×5): 800 mg via ORAL
  Filled 2020-12-20 (×5): qty 1

## 2020-12-20 MED ORDER — MEPERIDINE HCL 25 MG/ML IJ SOLN: 6.2500 mg | INTRAMUSCULAR | Status: DC | PRN

## 2020-12-20 MED ORDER — STERILE WATER FOR IRRIGATION IR SOLN
Status: DC | PRN
  Administered 2020-12-20: 1000 mL

## 2020-12-20 MED ORDER — ACETAMINOPHEN 10 MG/ML IV SOLN
1000.0000 mg | Freq: Once | INTRAVENOUS | Status: DC | PRN
  Administered 2020-12-20: 1000 mg via INTRAVENOUS

## 2020-12-20 MED ORDER — OXYCODONE HCL 5 MG/5ML PO SOLN: 5.0000 mg | Freq: Once | ORAL | Status: DC | PRN

## 2020-12-20 MED ORDER — SCOPOLAMINE 1 MG/3DAYS TD PT72
MEDICATED_PATCH | TRANSDERMAL | Status: AC
  Filled 2020-12-20: qty 1

## 2020-12-20 MED ORDER — SENNOSIDES-DOCUSATE SODIUM 8.6-50 MG PO TABS
2.0000 | ORAL_TABLET | Freq: Every day | ORAL | Status: DC
  Administered 2020-12-21 – 2020-12-22 (×2): 2 via ORAL
  Filled 2020-12-20 (×2): qty 2

## 2020-12-20 MED ORDER — OXYCODONE-ACETAMINOPHEN 5-325 MG PO TABS
2.0000 | ORAL_TABLET | ORAL | Status: DC | PRN
  Administered 2020-12-21 – 2020-12-22 (×2): 2 via ORAL
  Filled 2020-12-20 (×2): qty 2

## 2020-12-20 MED ORDER — AMISULPRIDE (ANTIEMETIC) 5 MG/2ML IV SOLN
10.0000 mg | Freq: Once | INTRAVENOUS | Status: AC | PRN
  Administered 2020-12-20: 10 mg via INTRAVENOUS

## 2020-12-20 MED ORDER — PRENATAL MULTIVITAMIN CH
1.0000 | ORAL_TABLET | Freq: Every day | ORAL | Status: DC
  Administered 2020-12-21 – 2020-12-22 (×2): 1 via ORAL
  Filled 2020-12-20 (×2): qty 1

## 2020-12-20 MED ORDER — KETOROLAC TROMETHAMINE 30 MG/ML IJ SOLN
30.0000 mg | Freq: Four times a day (QID) | INTRAMUSCULAR | Status: AC
  Administered 2020-12-20 – 2020-12-21 (×3): 30 mg via INTRAVENOUS
  Filled 2020-12-20 (×3): qty 1

## 2020-12-20 MED ORDER — FENTANYL CITRATE (PF) 100 MCG/2ML IJ SOLN: INTRAMUSCULAR | Status: DC | PRN

## 2020-12-20 SURGICAL SUPPLY — 33 items
BENZOIN TINCTURE PRP APPL 2/3 (GAUZE/BANDAGES/DRESSINGS) ×2 IMPLANT
CHLORAPREP W/TINT 26ML (MISCELLANEOUS) ×2 IMPLANT
CLAMP CORD UMBIL (MISCELLANEOUS) IMPLANT
CLOSURE STERI STRIP 1/2 X4 (GAUZE/BANDAGES/DRESSINGS) ×2 IMPLANT
CLOTH BEACON ORANGE TIMEOUT ST (SAFETY) ×2 IMPLANT
DRSG OPSITE POSTOP 4X10 (GAUZE/BANDAGES/DRESSINGS) ×2 IMPLANT
ELECT REM PT RETURN 9FT ADLT (ELECTROSURGICAL) ×2
ELECTRODE REM PT RTRN 9FT ADLT (ELECTROSURGICAL) ×1 IMPLANT
EXTRACTOR VACUUM M CUP 4 TUBE (SUCTIONS) IMPLANT
GAUZE SPONGE 4X4 12PLY STRL LF (GAUZE/BANDAGES/DRESSINGS) ×4 IMPLANT
GLOVE BIOGEL PI IND STRL 7.0 (GLOVE) ×2 IMPLANT
GLOVE BIOGEL PI IND STRL 7.5 (GLOVE) ×2 IMPLANT
GLOVE BIOGEL PI INDICATOR 7.0 (GLOVE) ×2
GLOVE BIOGEL PI INDICATOR 7.5 (GLOVE) ×2
GLOVE ECLIPSE 7.5 STRL STRAW (GLOVE) ×2 IMPLANT
GOWN STRL REUS W/TWL LRG LVL3 (GOWN DISPOSABLE) ×6 IMPLANT
KIT ABG SYR 3ML LUER SLIP (SYRINGE) IMPLANT
NEEDLE HYPO 25X5/8 SAFETYGLIDE (NEEDLE) IMPLANT
NS IRRIG 1000ML POUR BTL (IV SOLUTION) ×2 IMPLANT
PACK C SECTION WH (CUSTOM PROCEDURE TRAY) ×2 IMPLANT
PAD ABD 7.5X8 STRL (GAUZE/BANDAGES/DRESSINGS) ×2 IMPLANT
PAD OB MATERNITY 4.3X12.25 (PERSONAL CARE ITEMS) ×2 IMPLANT
PENCIL SMOKE EVAC W/HOLSTER (ELECTROSURGICAL) ×2 IMPLANT
RTRCTR C-SECT PINK 25CM LRG (MISCELLANEOUS) ×2 IMPLANT
STRIP CLOSURE SKIN 1/2X4 (GAUZE/BANDAGES/DRESSINGS) ×2 IMPLANT
SUT VIC AB 0 CTX 36 (SUTURE) ×3
SUT VIC AB 0 CTX36XBRD ANBCTRL (SUTURE) ×3 IMPLANT
SUT VIC AB 2-0 CT1 27 (SUTURE) ×1
SUT VIC AB 2-0 CT1 TAPERPNT 27 (SUTURE) ×1 IMPLANT
SUT VIC AB 4-0 KS 27 (SUTURE) ×2 IMPLANT
TOWEL OR 17X24 6PK STRL BLUE (TOWEL DISPOSABLE) ×2 IMPLANT
TRAY FOLEY W/BAG SLVR 14FR LF (SET/KITS/TRAYS/PACK) ×2 IMPLANT
WATER STERILE IRR 1000ML POUR (IV SOLUTION) ×2 IMPLANT

## 2020-12-20 NOTE — Op Note (Signed)
Allison Oconnell PROCEDURE DATE: 12/20/2020  PREOPERATIVE DIAGNOSIS: Intrauterine pregnancy at  [redacted]w[redacted]d weeks gestation; breech position, SROM  POSTOPERATIVE DIAGNOSIS: The same  PROCEDURE: Primary Low Transverse Cesarean Section  SURGEON:  Dr. Candelaria Celeste  ASSISTANT:   INDICATIONS: Allison Oconnell is a 30 y.o. [id number] at [redacted]w[redacted]d scheduled for cesarean section secondary to breech position, SROM.  The risks of cesarean section discussed with the patient included but were not limited to: bleeding which may require transfusion or reoperation; infection which may require antibiotics; injury to bowel, bladder, ureters or other surrounding organs; injury to the fetus; need for additional procedures including hysterectomy in the event of a life-threatening hemorrhage; placental abnormalities wth subsequent pregnancies, incisional problems, thromboembolic phenomenon and other postoperative/anesthesia complications. The patient concurred with the proposed plan, giving informed written consent for the procedure.    FINDINGS:  Viable female infant in breech presentation.  Apgars 9 and 9, weight, 6 pounds and 14 ounces.  Clear amniotic fluid.  Intact placenta, three vessel cord.  Normal uterus, fallopian tubes and ovaries bilaterally.  ANESTHESIA:    Spinal INTRAVENOUS FLUIDS:1650 ml ESTIMATED BLOOD LOSS: 336 ml URINE OUTPUT:  75 ml SPECIMENS: Placenta sent to L&D COMPLICATIONS: None immediate  PROCEDURE IN DETAIL:  The patient received intravenous antibiotics and had sequential compression devices applied to her lower extremities while in the preoperative area.  She was then taken to the operating room where spinal anesthesia was administered and was found to be adequate. She was then placed in a dorsal supine position with a leftward tilt, and prepped and draped in a sterile manner.  A foley catheter was placed into her bladder and attached to constant gravity, which drained clear fluid throughout.  After an  adequate timeout was performed, a Pfannenstiel skin incision was made with scalpel and carried through to the underlying layer of fascia. The fascia was incised in the midline and this incision was extended bilaterally using the Mayo scissors. Kocher clamps were applied to the superior aspect of the fascial incision and the underlying rectus muscles were dissected off bluntly. A similar process was carried out on the inferior aspect of the facial incision. The rectus muscles were separated in the midline bluntly and the peritoneum was entered bluntly. An Alexis retractor was placed to aid in visualization of the uterus.  Attention was turned to the lower uterine segment where a transverse hysterotomy was made with a scalpel and extended bilaterally bluntly. The infant was successfully delivered, and cord was clamped and cut and infant was handed over to awaiting neonatology team. Uterine massage was then administered and the placenta delivered intact with three-vessel cord. The uterus was then cleared of clot and debris.  The hysterotomy was closed with 0 Vicryl in a running locked fashion, and an imbricating layer was also placed with a 0 Vicryl. Overall, excellent hemostasis was noted. The abdomen and the pelvis were cleared of all clot and debris and the Jon Gills was removed. Hemostasis was confirmed on all surfaces.  The peritoneum was reapproximated using 2-0 vicryl running stitches. The fascia was then closed using 0 Vicryl in a running fashion. The subcutaneous layer was reapproximated with plain gut and the skin was closed with 4-0 vicryl. The patient tolerated the procedure well. Sponge, lap, instrument and needle counts were correct x 2. She was taken to the recovery room in stable condition.    Allison Heritage, DO 12/20/2020 3:13 PM

## 2020-12-20 NOTE — H&P (Signed)
Faculty Practice H&P  Bennette Hasty is a 30 y.o. female [phone] with IUP at [redacted]w[redacted]d presenting for ROM at 8am. She was coming in for preadmission screening labs when her water broke. Pregnancy was been complicated by bicornuate uterus, h/o IUFD, Rh neg, breech position. She had a failed version on 2/25.    Pt states she has been having intermittent contractions, no vaginal bleeding, with normal fetal movement.     Prenatal Course Source of Care: [facility] with onset of care at 12 weeks  Pregnancy complications or risks: Patient Active Problem List   Diagnosis Date Noted  . Fetal malpresentation 12/13/2020  . Malpresentation before onset of labor 12/09/2020  . Bicornuate uterus affecting pregnancy, antepartum 11/21/2020  . Pelvic pain affecting pregnancy in second trimester, antepartum 09/20/2020  . History of IUFD 06/16/2020  . Rh negative state in antepartum period 06/16/2020  . Depression affecting pregnancy, antepartum 06/16/2020  . Supervision of normal pregnancy 06/09/2020  . Contact dermatitis 12/29/2019  . Class 2 obesity due to excess calories without serious comorbidity with body mass index (BMI) of 39.0 to 39.9 in adult 03/04/2017   She desires oral progesterone-only contraceptive for contraception.  She plans to breastfeed  Prenatal labs and studies: ABO, Rh: --/--/O NEG (02/25 0948) Antibody: NEG (02/25 0948) Rubella: 4.67 (10/04 0907) RPR: NON-REACTIVE (12/21 0000)  HBsAg: NON-REACTIVE (10/04 0907)  HIV: NON-REACTIVE (12/21 0000)  GBS:    2hr Glucola: negative Genetic screening: declined Anatomy US: normal  Past Medical History:  Past Medical History:  Diagnosis Date  . Bicornuate uterus   . Depression   . Obesity     Past Surgical History:  Past Surgical History:  Procedure Laterality Date  . GALLBLADDER SURGERY     may 2009    Obstetrical History:  OB History    Gravida  6   Para  4   Term  2   Preterm  1   AB  1   Living  2     SAB  1    IAB      Ectopic      Multiple      Live Births  2           Gynecological History:  OB History    Gravida  6   Para  4   Term  2   Preterm  1   AB  1   Living  2     SAB  1   IAB      Ectopic      Multiple      Live Births  2           Social History:  Social History   Socioeconomic History  . Marital status: Married    Spouse name: Not on file  . Number of children: Not on file  . Years of education: Not on file  . Highest education level: Not on file  Occupational History  . Occupation: homemaker  Tobacco Use  . Smoking status: Never Smoker  . Smokeless tobacco: Never Used  Vaping Use  . Vaping Use: Never used  Substance and Sexual Activity  . Alcohol use: Never  . Drug use: Never  . Sexual activity: Yes    Birth control/protection: None  Other Topics Concern  . Not on file  Social History Narrative  . Not on file   Social Determinants of Health   Financial Resource Strain: Not on file  Food Insecurity: Not on file  Transportation Needs: Not on file  Physical Activity: Not on file  Stress: Not on file  Social Connections: Not on file    Family History:  Family History  Problem Relation Age of Onset  . Melanoma Maternal Grandmother   . Depression Mother   . Hypertension Father   . Stroke Father   . Lung cancer Paternal Grandfather   . Skin cancer Paternal Grandmother     Medications:  Prenatal vitamins,  Current Facility-Administered Medications  Medication Dose Route Frequency Provider Last Rate Last Admin  . ceFAZolin (ANCEF) IVPB 2g/100 mL premix  2 g Intravenous On Call to OR Levie Heritage, DO      . lactated ringers infusion  125 mL/hr Intravenous Continuous Levie Heritage, DO      . povidone-iodine 10 % swab 2 application  2 application Topical Once Levie Heritage, DO        Allergies:  Allergies  Allergen Reactions  . Sulfa Antibiotics Rash    Review of Systems: - neg  Physical Exam: Blood  pressure 114/74, pulse 98, temperature 97.8 F (36.6 C), resp. rate 19, weight 100.5 kg, last menstrual period 03/24/2020. GENERAL: Well-developed, well-nourished female in no acute distress.  LUNGS: Clear to auscultation bilaterally.  HEART: Regular rate and rhythm. ABDOMEN: Soft, nontender, nondistended, gravid. EXTREMITIES: Nontender, no edema, 2+ distal pulses. Presentation: breech  FHT:  Baseline rate 120 bpm   Variability moderate  Accelerations present   Decelerations none   Pertinent Labs/Studies:   Lab Results  Component Value Date   WBC 12.3 (H) 12/09/2020   HGB 12.8 12/09/2020   HCT 38.0 12/09/2020   MCV 91.8 12/09/2020   PLT 162 12/09/2020    Assessment : Amandalynn Pitz is a 30 y.o. [id number] at [redacted]w[redacted]d being admitted for cesarean section secondary to breech position and ROM  Plan: The risks of cesarean section discussed with the patient included but were not limited to: bleeding which may require transfusion or reoperation; infection which may require antibiotics; injury to bowel, bladder, ureters or other surrounding organs; injury to the fetus; need for additional procedures including hysterectomy in the event of a life-threatening hemorrhage; placental abnormalities wth subsequent pregnancies, incisional problems, thromboembolic phenomenon and other postoperative/anesthesia complications. The patient concurred with the proposed plan, giving informed written consent for the procedure.   Patient has been NPO since last night and will remain NPO for procedure.  Preoperative prophylactic Ancef ordered on call to the OR.    Levie Heritage, DO 12/20/2020, 11:04 AM

## 2020-12-20 NOTE — Anesthesia Preprocedure Evaluation (Addendum)
Anesthesia Evaluation  Patient identified by MRN, date of birth, ID band Patient awake    Reviewed: Allergy & Precautions, H&P , NPO status , Patient's Chart, lab work & pertinent test results  Airway Mallampati: II  TM Distance: >3 FB Neck ROM: Full    Dental no notable dental hx.    Pulmonary neg pulmonary ROS,    Pulmonary exam normal breath sounds clear to auscultation       Cardiovascular negative cardio ROS Normal cardiovascular exam Rhythm:Regular Rate:Normal     Neuro/Psych PSYCHIATRIC DISORDERS Depression negative neurological ROS     GI/Hepatic negative GI ROS, Neg liver ROS,   Endo/Other  Morbid obesity  Renal/GU negative Renal ROS  negative genitourinary   Musculoskeletal negative musculoskeletal ROS (+)   Abdominal (+) + obese,   Peds negative pediatric ROS (+)  Hematology negative hematology ROS (+)   Anesthesia Other Findings   Reproductive/Obstetrics (+) Pregnancy                            Anesthesia Physical Anesthesia Plan  ASA: III  Anesthesia Plan: Spinal   Post-op Pain Management:    Induction:   PONV Risk Score and Plan: 2 and Ondansetron, Scopolamine patch - Pre-op and Treatment may vary due to age or medical condition  Airway Management Planned: Natural Airway  Additional Equipment: None  Intra-op Plan:   Post-operative Plan:   Informed Consent: I have reviewed the patients History and Physical, chart, labs and discussed the procedure including the risks, benefits and alternatives for the proposed anesthesia with the patient or authorized representative who has indicated his/her understanding and acceptance.       Plan Discussed with: CRNA, Anesthesiologist and Surgeon  Anesthesia Plan Comments:        Anesthesia Quick Evaluation

## 2020-12-20 NOTE — Transfer of Care (Signed)
Immediate Anesthesia Transfer of Care Note  Patient: Allison Oconnell  Procedure(s) Performed: CESAREAN SECTION (N/A )  Patient Location: PACU  Anesthesia Type:Spinal  Level of Consciousness: awake  Airway & Oxygen Therapy: Patient Spontanous Breathing  Post-op Assessment: Report given to RN and Post -op Vital signs reviewed and stable  Post vital signs: Reviewed and stable  Last Vitals:  Vitals Value Taken Time  BP 93/54 12/20/20 1421  Temp 36.6 C 12/20/20 1418  Pulse 74 12/20/20 1423  Resp 20 12/20/20 1423  SpO2 100 % 12/20/20 1423  Vitals shown include unvalidated device data.  Last Pain:  Vitals:   12/20/20 1418  TempSrc: Oral  PainSc: 0-No pain         Complications: No complications documented.

## 2020-12-20 NOTE — Progress Notes (Signed)
BSUS: breech, head to maternal right

## 2020-12-20 NOTE — Anesthesia Postprocedure Evaluation (Signed)
Anesthesia Post Note  Patient: Allison Oconnell  Procedure(s) Performed: CESAREAN SECTION (N/A )     Patient location during evaluation: PACU Anesthesia Type: Spinal Level of consciousness: oriented and awake and alert Pain management: pain level controlled Vital Signs Assessment: post-procedure vital signs reviewed and stable Respiratory status: spontaneous breathing and respiratory function stable Cardiovascular status: blood pressure returned to baseline and stable Postop Assessment: no headache, no backache, no apparent nausea or vomiting and able to ambulate Anesthetic complications: no   No complications documented.  Last Vitals:  Vitals:   12/20/20 1515 12/20/20 1535  BP: (!) 99/57 (!) 97/53  Pulse: 87 91  Resp: 16 18  Temp: 36.6 C 36.6 C  SpO2: 100% 100%    Last Pain:  Vitals:   12/20/20 1535  TempSrc: Oral  PainSc:    Pain Goal:    LLE Motor Response: Purposeful movement (12/20/20 1515) LLE Sensation: Tingling (12/20/20 1515) RLE Motor Response: Purposeful movement (12/20/20 1515) RLE Sensation: Tingling (12/20/20 1515)     Epidural/Spinal Function Cutaneous sensation: Tingles (12/20/20 1515), Patient able to flex knees: Yes (12/20/20 1515), Patient able to lift hips off bed: No (12/20/20 1515), Back pain beyond tenderness at insertion site: No (12/20/20 1515), Progressively worsening motor and/or sensory loss: No (12/20/20 1515), Bowel and/or bladder incontinence post epidural: No (12/20/20 1515)  Mellody Dance

## 2020-12-20 NOTE — MAU Note (Signed)
At 0800 states she had a large gush of clear fluid.  Denies any VB.  States baby is still breech as of yesterday.  Denies any other complications w/ her pregnancy.  Denies any CTX.  Endorses + FM.

## 2020-12-20 NOTE — Anesthesia Procedure Notes (Signed)
Spinal  Patient location during procedure: OR Start time: 12/20/2020 1:10 PM End time: 12/20/2020 1:15 PM Staffing Performed: anesthesiologist  Anesthesiologist: Mellody Dance, MD Preanesthetic Checklist Completed: patient identified, IV checked, risks and benefits discussed, surgical consent, monitors and equipment checked, pre-op evaluation and timeout performed Spinal Block Patient position: sitting Prep: DuraPrep and site prepped and draped Patient monitoring: continuous pulse ox, blood pressure and heart rate Approach: midline Location: L3-4 Injection technique: single-shot Needle Needle type: Pencan  Needle gauge: 24 G Needle length: 9 cm Additional Notes Functioning IV was confirmed and monitors were applied. Sterile prep and drape, including hand hygiene and sterile gloves were used. The patient was positioned and the spine was prepped. The skin was anesthetized with lidocaine.  Free flow of clear CSF was obtained prior to injecting local anesthetic into the CSF. The needle was carefully withdrawn. The patient tolerated the procedure well.

## 2020-12-21 LAB — RPR: RPR Ser Ql: NONREACTIVE

## 2020-12-21 LAB — CBC
HCT: 30.1 % — ABNORMAL LOW (ref 36.0–46.0)
Hemoglobin: 10.3 g/dL — ABNORMAL LOW (ref 12.0–15.0)
MCH: 30.7 pg (ref 26.0–34.0)
MCHC: 34.2 g/dL (ref 30.0–36.0)
MCV: 89.6 fL (ref 80.0–100.0)
Platelets: 135 10*3/uL — ABNORMAL LOW (ref 150–400)
RBC: 3.36 MIL/uL — ABNORMAL LOW (ref 3.87–5.11)
RDW: 14.5 % (ref 11.5–15.5)
WBC: 13.4 10*3/uL — ABNORMAL HIGH (ref 4.0–10.5)
nRBC: 0 % (ref 0.0–0.2)

## 2020-12-21 LAB — BIRTH TISSUE RECOVERY COLLECTION (PLACENTA DONATION)

## 2020-12-21 MED ORDER — RHO D IMMUNE GLOBULIN 1500 UNIT/2ML IJ SOSY
300.0000 ug | PREFILLED_SYRINGE | Freq: Once | INTRAMUSCULAR | Status: AC
  Administered 2020-12-21: 300 ug via INTRAVENOUS
  Filled 2020-12-21: qty 2

## 2020-12-21 NOTE — Progress Notes (Addendum)
POSTPARTUM PROGRESS NOTE  Subjective: Allison Oconnell is a 30 y.o. [id number] POD#1 PCS due to breech presentation at [redacted]w[redacted]d.  She reports she is doing well and wants to go home tomorrow. No acute events overnight. She denies any problems with ambulating, voiding or po intake. Denies fever, nausea, or vomiting. She has passed flatus. Pain is well controlled.  Lochia is appropriate.  Objective: Blood pressure (!) 92/54, pulse 60, temperature 98.4 F (36.9 C), temperature source Oral, resp. rate 16, weight 100.5 kg, last menstrual period 03/24/2020, SpO2 98 %. Physical Exam:  General: alert, cooperative and no distress Chest: no respiratory distress Abdomen: soft, non-tender  Incision: pressure dressing c/d/i Uterine Fundus: firm and at level of umbilicus Extremities: No calf swelling or tenderness  no edema  Recent Labs    12/20/20 1045 12/21/20 0635  HGB 13.0 10.3*  HCT 38.1 30.1*    Assessment/Plan: Allison Oconnell is a 30 y.o. [id number] POD#1 PCS at [redacted]w[redacted]d for post-operative care.  Routine Postpartum Care: Doing well, pain well-controlled.  -- Continue routine care, lactation support  -- Contraception: POPs -- Feeding: Both breast and formula -- Circumcision: Has consented   Dispo: Plan for discharge on POD#2.  Lendon Ka, Medical Student 12/21/2020 7:33 AM   GME ATTESTATION:  I saw and evaluated the patient. I agree with the findings and the plan of care as documented in the medical student's note.  Alric Seton, MD OB Fellow, Faculty [facility] 12/21/2020 8:10 AM

## 2020-12-21 NOTE — Social Work (Signed)
CSW received consult for hx of Postpartum Depression.  CSW met with MOB to offer support and complete assessment.     CSW introduced self and role. Infant was in circumcision procedure at time of assessment. CSW informed MOB of reason for consult and assessed MOB current emotions. MOB reported she is feeling good and happy. MOB stated she also had a pretty good pregnancy. 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 MOB reported she was prescribed Zoloft 50mg, which she is currently still utilizing. MOB expressed the Zoloft was more helpful prior to the pregnancy, although it still somewhat helps with managing emotions. MOB stated she has never utilized therapy. Aside from FOB, MOB identified good friends as supports. MOB denies any current SI, HI or being involved in DV.   CSW provided education regarding the baby blues period versus PPD and provided MOB with mental health resources. CSW provided the New Mom Checklist and encouraged MOB to self evaluate and contact a medical professional if symptoms are noted at any time.  CSW provided review of Sudden Infant Death Syndrome (SIDS) precautions. MOB reported she has all infant essentials, including a bassinet. MOB denies any barriers to follow-up care with [facility]. MOB expressed no additional needs at this time.   CSW identifies no further need for intervention and no barriers to discharge at this time.  Allison Oconnell, LCSWA Clinical Social Work [facility] [phone] 

## 2020-12-22 ENCOUNTER — Inpatient Hospital Stay (HOSPITAL_COMMUNITY)
Admission: RE | Admit: 2020-12-22 | Payer: BC Managed Care – PPO | Source: Home / Self Care | Admitting: Obstetrics and Gynecology

## 2020-12-22 ENCOUNTER — Encounter (HOSPITAL_COMMUNITY)
Admission: RE | Disposition: A | Discharge status: Home / Self Care | Payer: Self-pay | Source: Ambulatory Visit | Attending: Obstetrics & Gynecology

## 2020-12-22 LAB — RH IG WORKUP (INCLUDES ABO/RH)
ABO/RH(D): O NEG
Fetal Screen: NEGATIVE
Gestational Age(Wks): 38.5
Unit division: 0

## 2020-12-22 SURGERY — Surgical Case
Anesthesia: Regional

## 2020-12-22 MED ORDER — OXYCODONE-ACETAMINOPHEN 5-325 MG PO TABS
1.0000 | ORAL_TABLET | ORAL | 0 refills | Status: DC | PRN
Start: 2020-12-22 — End: 2021-03-06

## 2020-12-22 MED ORDER — IBUPROFEN 600 MG PO TABS: 600.0000 mg | ORAL_TABLET | Freq: Four times a day (QID) | ORAL | 0 refills | Status: DC | PRN

## 2020-12-22 NOTE — Discharge Summary (Addendum)
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 yo [id number] admitted to the hospital 12/20/2020 for scheduled cesarean section with the following indication:Malpresentation. She was originally scheduled for pLTCS on 3/10, but as she presented for preadmit labs, she had SROM. Delivery details are as follows:  Membrane Rupture Time/Date: 8:00 AM ,12/20/2020   Delivery Method:C-Section, Low Transverse  Details of operation can be found in separate operative note.  Patient had an uncomplicated postpartum course. Her POD#1 hgb was 10.3 (13 on admission).  She is ambulating, tolerating a regular diet, passing flatus, and urinating well. She received Rhogam on POD#1. Patient is discharged home in stable condition on  12/22/20 per her request for early d/c as long as the baby can go as well.        Newborn Data: Birth date:12/20/2020  Birth time:1:30 PM  Gender:Female  Living status:Living  Apgars:9 ,9  Weight:3130 g  (6lb 14.4oz)   Magnesium Sulfate received: No BMZ received: No Rhophylac:Yes MMR:No T-DaP:Given prenatally Flu: No Transfusion:No  Physical exam  Vitals:   12/21/20 0954 12/21/20 1430 12/21/20 1959 12/22/20 0526  BP: 100/60 (!) 94/54 (!) 97/52 101/70  Pulse:  (!) 59 (!) 59 66 74  Resp: 17 18 18 16   Temp: 98 F (36.7 C) 98 F (36.7 C) 98.5 F (36.9 C) 98.2 F (36.8 C)  TempSrc: Oral Oral Oral Oral  SpO2: 97%  99% 100%  Weight:       General: alert, cooperative and no distress Lochia: appropriate Uterine Fundus: firm Incision: Healing well with no significant drainage, No significant erythema, Dressing is clean, dry, and intact DVT Evaluation: No evidence of DVT seen on physical exam. Labs: Lab Results  Component Value Date   WBC 13.4 (H) 12/21/2020   HGB 10.3 (L) 12/21/2020   HCT 30.1 (L) 12/21/2020   MCV 89.6 12/21/2020   PLT 135 (L) 12/21/2020   CMP Latest Ref Rng & Units 03/04/2017  Glucose 65 - 99 mg/dL 78  BUN 7 - 25 mg/dL 12  Creatinine 0.50 - 1.10 mg/dL 0.71  Sodium 135 - 146 mmol/L 139  Potassium 3.5 - 5.3 mmol/L 4.2  Chloride 98 - 110 mmol/L 103  CO2 20 - 31 mmol/L 26  Calcium 8.6 - 10.2 mg/dL 9.6  Total Protein 6.1 - 8.1 g/dL 7.3  Total Bilirubin 0.2 - 1.2 mg/dL 0.4  Alkaline Phos 33 - 115 U/L 94  AST 10 - 30 U/L 21  ALT 6 - 29 U/L 22   Edinburgh Score: Edinburgh Postnatal Depression Scale Screening Tool 12/22/2020  I have been able to laugh and see the  funny side of things. 0  I have looked forward with enjoyment to things. 0  I have blamed myself unnecessarily when things went wrong. 1  I have been anxious or worried for no good reason. 2  I have felt scared or panicky for no good reason. 0  Things have been getting on top of me. 1  I have been so unhappy that I have had difficulty sleeping. 0  I have felt sad or miserable. 0  I have been so unhappy that I have been crying. 0  The thought of harming myself has occurred to me. 0  Edinburgh Postnatal Depression Scale Total 4      After visit meds:  Allergies as of 12/22/2020      Reactions   Sulfa Antibiotics Rash      Medication List    STOP taking these medications   acetaminophen 325 MG tablet Commonly known as: TYLENOL   aspirin EC 81 MG  tablet   Breast Pump [place] these medications   prenatal multivitamin Tabs tablet Take 1 tablet by mouth daily at 12 noon.   sertraline 50 MG tablet Commonly known as: ZOLOFT Take 50 mg by mouth daily.            Discharge Care Instructions  (From admission, onward)         Start     Ordered   12/22/20 0000  Discharge wound care:       Comments: Take dressing off on day 7 post-surgery   12/22/20 0746           Discharge home in stable condition Infant Feeding: Both Infant Disposition:home with mother Discharge instruction: per After Visit Summary and Postpartum booklet. Activity: Advance as tolerated. Pelvic rest for 6 weeks.  Diet: routine diet Anticipated Birth Control: POPs Postpartum Appointment:6 weeks Additional Postpartum F/U:  6 weeks  Future Appointments  Date Time Provider [place]  01/05/2021 10:30 AM Sloan Leiter, MD [facility]  02/02/2021  9:30 AM Lavonia Drafts, MD [facility]   Follow up Visit:      12/22/2020 Myrtis Ser, CNM

## 2020-12-22 NOTE — Discharge Instructions (Signed)
Cesarean Delivery, Care After This sheet gives you information about how to care for yourself after your procedure. Your health care provider may also give you more specific instructions. If you have problems or questions, contact your health care provider. What can I expect after the procedure? After the procedure, it is common to have:  A small amount of blood or clear fluid coming from the incision.  Some redness, swelling, and pain in your incision area.  Some abdominal pain and soreness.  Vaginal bleeding (lochia). Even though you did not have a vaginal delivery, you will still have vaginal bleeding and discharge.  Pelvic cramps.  Fatigue. You may have pain, swelling, and discomfort in the tissue between your vagina and your anus (perineum) if:  Your C-section was unplanned, and you were allowed to labor and push.  An incision was made in the area (episiotomy) or the tissue tore during attempted vaginal delivery. Follow these instructions at home: Incision care  Follow instructions from your health care provider about how to take care of your incision. Make sure you: ? Wash your hands with soap and water before you change your bandage (dressing). If soap and water are not available, use hand sanitizer. ? If you have a dressing, change it or remove it as told by your health care provider. ? Leave stitches (sutures), skin staples, skin glue, or adhesive strips in place. These skin closures may need to stay in place for 2 weeks or longer. If adhesive strip edges start to loosen and curl up, you may trim the loose edges. Do not remove adhesive strips completely unless your health care provider tells you to do that.  Check your incision area every day for signs of infection. Check for: ? More redness, swelling, or pain. ? More fluid or blood. ? Warmth. ? Pus or a bad smell.  Do not take baths, swim, or use a hot tub until your health care provider says it's okay. Ask your health  care provider if you can take showers.  When you cough or sneeze, hug a pillow. This helps with pain and decreases the chance of your incision opening up (dehiscing). Do this until your incision heals.   Medicines  Take over-the-counter and prescription medicines only as told by your health care provider.  If you were prescribed an antibiotic medicine, take it as told by your health care provider. Do not stop taking the antibiotic even if you start to feel better.  Do not drive or use heavy machinery while taking prescription pain medicine. Lifestyle  Do not drink alcohol. This is especially important if you are breastfeeding or taking pain medicine.  Do not use any products that contain nicotine or tobacco, such as cigarettes, e-cigarettes, and chewing tobacco. If you need help quitting, ask your health care provider. Eating and drinking  Drink at least 8 eight-ounce glasses of water every day unless told not to by your health care provider. If you breastfeed, you may need to drink even more water.  Eat high-fiber foods every day. These foods may help prevent or relieve constipation. High-fiber foods include: ? Whole grain cereals and breads. ? Brown rice. ? Beans. ? Fresh fruits and vegetables. Activity  If possible, have someone help you care for your baby and help with household activities for at least a few days after you leave the hospital.  Return to your normal activities as told by your health care provider. Ask your health care provider what activities are safe for   you.  Rest as much as possible. Try to rest or take a nap while your baby is sleeping.  Do not lift anything that is heavier than 10 lbs (4.5 kg), or the limit that you were told, until your health care provider says that it is safe.  Talk with your health care provider about when you can engage in sexual activity. This may depend on your: ? Risk of infection. ? How fast you heal. ? Comfort and desire to  engage in sexual activity.   General instructions  Do not use tampons or douches until your health care provider approves.  Wear loose, comfortable clothing and a supportive and well-fitting bra.  Keep your perineum clean and dry. Wipe from front to back when you use the toilet.  If you pass a blood clot, save it and call your health care provider to discuss. Do not flush blood clots down the toilet before you get instructions from your health care provider.  Keep all follow-up visits for you and your baby as told by your health care provider. This is important. Contact a health care provider if:  You have: ? A fever. ? Bad-smelling vaginal discharge. ? Pus or a bad smell coming from your incision. ? Difficulty or pain when urinating. ? A sudden increase or decrease in the frequency of your bowel movements. ? More redness, swelling, or pain around your incision. ? More fluid or blood coming from your incision. ? A rash. ? Nausea. ? Little or no interest in activities you used to enjoy. ? Questions about caring for yourself or your baby.  Your incision feels warm to the touch.  Your breasts turn red or become painful or hard.  You feel unusually sad or worried.  You vomit.  You pass a blood clot from your vagina.  You urinate more than usual.  You are dizzy or light-headed. Get help right away if:  You have: ? Pain that does not go away or get better with medicine. ? Chest pain. ? Difficulty breathing. ? Blurred vision or spots in your vision. ? Thoughts about hurting yourself or your baby. ? New pain in your abdomen or in one of your legs. ? A severe headache.  You faint.  You bleed from your vagina so much that you fill more than one sanitary pad in one hour. Bleeding should not be heavier than your heaviest period. Summary  After the procedure, it is common to have pain at your incision site, abdominal cramping, and slight bleeding from your vagina.  Check  your incision area every day for signs of infection.  Tell your health care provider about any unusual symptoms.  Keep all follow-up visits for you and your baby as told by your health care provider. This information is not intended to replace advice given to you by your health care provider. Make sure you discuss any questions you have with your health care provider. Document Revised: 04/09/2018 Document Reviewed: 04/09/2018 Elsevier Patient Education  2021 Elsevier Inc.  

## 2020-12-26 DIAGNOSIS — M5489 Other dorsalgia: Secondary | ICD-10-CM | POA: Diagnosis not present

## 2020-12-27 ENCOUNTER — Other Ambulatory Visit: Payer: Self-pay

## 2020-12-27 ENCOUNTER — Ambulatory Visit (INDEPENDENT_AMBULATORY_CARE_PROVIDER_SITE_OTHER): Payer: BC Managed Care – PPO | Admitting: *Deleted

## 2020-12-27 DIAGNOSIS — Z5189 Encounter for other specified aftercare: Secondary | ICD-10-CM

## 2020-12-27 NOTE — Progress Notes (Signed)
Pt here for 1 week incision check from C Section.  Honeycomb dressing removed and steri strips are dry and intact.  Encouraged pt to dry the area lightly with hair dryer after getting out of shower.  Pt does have a rash around her waist where it appears there was adhesive.  She is to use benadryl cream on the area and Benadryl PO at night if needed.  She will f/u prn or @ here regular scheduled post partum appt.  Pt is aware to leave steri strips on and they will fall off as they are ready.

## 2021-01-05 ENCOUNTER — Ambulatory Visit: Payer: BC Managed Care – PPO | Admitting: Obstetrics and Gynecology

## 2021-01-25 ENCOUNTER — Telehealth: Payer: Self-pay

## 2021-01-25 DIAGNOSIS — F419 Anxiety disorder, unspecified: Secondary | ICD-10-CM

## 2021-01-25 MED ORDER — SERTRALINE HCL 50 MG PO TABS: 50.0000 mg | ORAL_TABLET | Freq: Every day | ORAL | 0 refills | Status: DC

## 2021-01-25 NOTE — Telephone Encounter (Signed)
Pt called stating her old GYN office prescribed Zoloft 50mg . She transferred care to and the Rx ran out. Pt states she needs a refill. Refill sent per [place], NP. PP appt scheduled for 02/02/21.

## 2021-02-01 NOTE — Progress Notes (Signed)
Obstetrics/Postpartum Visit  Appointment Date: 02/02/2021  [facility]N [facility]  Primary Care Provider: Everrett Coombe  Chief Complaint:  Chief Complaint  Patient presents with  . Postpartum Care    History of Present Illness: Icis Budreau is a 30 y.o. Caucasian [id number] (No LMP recorded.), seen for the above chief complaint. Her past medical history is significant for bicornuate uterus, Rh negative.   She is s/p 1LTCS on 12/20/20 at 38.5 weeks for SROM and breech presentation; she was discharged to home on POD#2. Pregnancy complicated by bicornuate uterus, breech presentation.  Complains of nothing, doing well. Husband is working from home 3 days a week and supportive, this has enabled her to get some additional sleep as needed.  Vaginal bleeding or discharge: No  Breast or formula feeding: breast Intercourse: Yes  Contraception: POP PP depression s/s: No  Any bowel or bladder issues: No  Pap smear: no abnormalities (date: 06/2020)  Review of Systems: Positive for n/a.   Her 12 point review of systems is negative or as noted in the History of Present Illness.  Patient Active Problem List   Diagnosis Date Noted  . Status post cesarean delivery 12/20/2020  . Fetal malpresentation 12/13/2020  . Malpresentation before onset of labor 12/09/2020  . Bicornuate uterus affecting pregnancy, antepartum 11/21/2020  . Pelvic pain affecting pregnancy in second trimester, antepartum 09/20/2020  . History of IUFD 06/16/2020  . Rh negative state in antepartum period 06/16/2020  . Depression affecting pregnancy, antepartum 06/16/2020  . Supervision of normal pregnancy 06/09/2020  . Contact dermatitis 12/29/2019  . Class 2 obesity due to excess calories without serious comorbidity with body mass index (BMI) of 39.0 to 39.9 in adult 03/04/2017    Medications Anysa Sebek had no medications administered during this visit. Current Outpatient Medications  Medication Sig Dispense  Refill  . ibuprofen (ADVIL) 600 MG tablet Take 1 tablet (600 mg total) by mouth every 6 (six) hours as needed. 30 tablet 0  . Prenatal Vit-Fe Fumarate-FA (PRENATAL MULTIVITAMIN) TABS tablet Take 1 tablet by mouth daily at 12 noon.    . sertraline (ZOLOFT) 50 MG tablet Take 1 tablet (50 mg total) by mouth daily. 31 tablet 0  . oxyCODONE-acetaminophen (PERCOCET/ROXICET) 5-325 MG tablet Take 1 tablet by mouth every 4 (four) hours as needed for moderate pain or severe pain. (Patient not taking: No sig reported) 20 tablet 0   No current facility-administered medications for this visit.   Allergies Sulfa antibiotics  Physical Exam:  BP 94/67   Pulse 77   Resp 16   Ht 5\' 1"  (1.549 m)   Wt 213 lb (96.6 kg)   Breastfeeding Yes   BMI 40.25 kg/m  Body mass index is 40.25 kg/m. General appearance: Well nourished, well developed female in no acute distress.  Cardiovascular: regular rate and rhythm Respiratory:  Normal respiratory effort Abdomen: no masses, hernias; diffusely non tender to palpation, non distended, well healed pfannenstiel incision Breasts: not examined. Neuro/Psych:  Normal mood and affect.  Skin:  Warm and dry.   PP Depression Screening:    Edinburgh Postnatal Depression Scale - 02/02/21 0948      Edinburgh Postnatal Depression Scale:  In the Past 7 Days   I have been able to laugh and see the funny side of things. 0    I have looked forward with enjoyment to things. 0    I have blamed myself unnecessarily when things went wrong. 2    I have been anxious or  worried for no good reason. 1    I have felt scared or panicky for no good reason. 2    Things have been getting on top of me. 1    I have been so unhappy that I have had difficulty sleeping. 0    I have felt sad or miserable. 1    I have been so unhappy that I have been crying. 0    The thought of harming myself has occurred to me. 0    Edinburgh Postnatal Depression Scale Total 7           Assessment:  Patient is a 30 y.o. [id number] who is 6 weeks post partum from a primary c-section for breech. She is doing well.   Plan:  1. Postoperative state Doing well  2. Postpartum state  3. Malpresentation before onset of labor, single or unspecified fetus  4. Rh negative state in antepartum period  5. Status post cesarean delivery  6. Depression affecting pregnancy, antepartum Doing well on zoloft  Essential components of care per ACOG recommendations:  1.  Mood and well being: Patient with negative depression screening today. Reviewed local resources for support.  - Patient does not use tobacco.  - hx of drug use? No    2. Infant care and feeding:  -Patient currently breastmilk feeding? Yes Concerned about supply, offered referral to lactation, she will call if she would like to accept. Reviewed importance of draining breast regularly to support lactation. -Social determinants of health (SDOH) reviewed in EPIC. No concerns  3. Sexuality, contraception and birth spacing - Patient does not want a pregnancy in the next year.  Desired family size is unknown children.  - Reviewed forms of contraception in tiered fashion. Patient desired oral progesterone-only contraceptive today.   - Discussed birth spacing of 18 months  4. Sleep and fatigue -Encouraged family/partner/community support of 4 hrs of uninterrupted sleep to help with mood and fatigue  5. Physical Recovery  - Discussed patients delivery - Patient had a CS - Patient has urinary incontinence? No  - Patient is safe to resume physical and sexual activity  6.  Health Maintenance - Last pap smear done 06/2020 and was normal with negative HPV.   RTC 6 months for annual  K. Therese Sarah, MD, [facility] Attending Center for [facility])

## 2021-02-02 ENCOUNTER — Other Ambulatory Visit: Payer: Self-pay

## 2021-02-02 ENCOUNTER — Encounter: Payer: Self-pay | Admitting: Obstetrics and Gynecology

## 2021-02-02 ENCOUNTER — Ambulatory Visit (INDEPENDENT_AMBULATORY_CARE_PROVIDER_SITE_OTHER): Payer: BC Managed Care – PPO | Admitting: Obstetrics and Gynecology

## 2021-02-02 VITALS — BP 94/67 | HR 77 | Resp 16 | Ht 61.0 in | Wt 213.0 lb

## 2021-02-02 DIAGNOSIS — F32A Depression, unspecified: Secondary | ICD-10-CM

## 2021-02-02 DIAGNOSIS — Z6791 Unspecified blood type, Rh negative: Secondary | ICD-10-CM

## 2021-02-02 DIAGNOSIS — Z9889 Other specified postprocedural states: Secondary | ICD-10-CM

## 2021-02-02 DIAGNOSIS — O99340 Other mental disorders complicating pregnancy, unspecified trimester: Secondary | ICD-10-CM

## 2021-02-02 DIAGNOSIS — Z98891 History of uterine scar from previous surgery: Secondary | ICD-10-CM

## 2021-02-02 DIAGNOSIS — O26899 Other specified pregnancy related conditions, unspecified trimester: Secondary | ICD-10-CM

## 2021-02-02 DIAGNOSIS — O329XX0 Maternal care for malpresentation of fetus, unspecified, not applicable or unspecified: Secondary | ICD-10-CM

## 2021-02-08 ENCOUNTER — Telehealth: Payer: Self-pay

## 2021-02-08 DIAGNOSIS — Z789 Other specified health status: Secondary | ICD-10-CM

## 2021-02-08 NOTE — Telephone Encounter (Signed)
error 

## 2021-02-20 ENCOUNTER — Other Ambulatory Visit: Payer: Self-pay | Admitting: Family Medicine

## 2021-02-20 MED ORDER — NORETHINDRONE 0.35 MG PO TABS: 1.0000 | ORAL_TABLET | Freq: Every day | ORAL | 6 refills | Status: DC

## 2021-02-20 NOTE — Telephone Encounter (Signed)
Pt sent MyChart message stating her birth control has not been sent to her pharmacy. Per Dr.Davis last note pt wanted Micronor. Rx sent.

## 2021-03-06 ENCOUNTER — Encounter: Payer: Self-pay | Admitting: Family Medicine

## 2021-03-06 ENCOUNTER — Other Ambulatory Visit: Payer: Self-pay

## 2021-03-06 ENCOUNTER — Ambulatory Visit (INDEPENDENT_AMBULATORY_CARE_PROVIDER_SITE_OTHER): Payer: BC Managed Care – PPO | Admitting: Family Medicine

## 2021-03-06 DIAGNOSIS — E6609 Other obesity due to excess calories: Secondary | ICD-10-CM

## 2021-03-06 DIAGNOSIS — Z6839 Body mass index (BMI) 39.0-39.9, adult: Secondary | ICD-10-CM

## 2021-03-06 MED ORDER — PLENITY WELCOME KIT PO CAPS: 2.2500 g | ORAL_CAPSULE | Freq: Two times a day (BID) | ORAL | 2 refills | Status: DC

## 2021-03-06 NOTE — Progress Notes (Signed)
Allison Oconnell - 30 y.o. female MRN [id number]  Date of birth: 12/12/1990  Subjective Chief Complaint  Patient presents with  . Establish Care    HPI Allison Oconnell is a 30 y.o. female here today to discuss weight management.  She has previously taken phentermine and had good success with this.  She would like to discuss alternatives to this as she is currently breast feeding.  She is exercising and feels that she is eating pretty well.   ROS:  A comprehensive ROS was completed and negative except as noted per HPI  Allergies  Allergen Reactions  . Sulfa Antibiotics Rash    Past Medical History:  Diagnosis Date  . Bicornuate uterus   . Depression   . Obesity     Past Surgical History:  Procedure Laterality Date  . CESAREAN SECTION N/A 12/20/2020   Procedure: CESAREAN SECTION;  Surgeon: Truett Mainland, DO;  Location: [facility];  Service: Obstetrics;  Laterality: N/A;  . GALLBLADDER SURGERY     may 2009    Social History   Socioeconomic History  . Marital status: Married    Spouse name: Not on file  . Number of children: Not on file  . Years of education: Not on file  . Highest education level: Not on file  Occupational History  . Occupation: homemaker  Tobacco Use  . Smoking status: Never Smoker  . Smokeless tobacco: Never Used  Vaping Use  . Vaping Use: Never used  Substance and Sexual Activity  . Alcohol use: Never  . Drug use: Never  . Sexual activity: Yes    Birth control/protection: None  Other Topics Concern  . Not on file  Social History Narrative  . Not on file   Social Determinants of Health   Financial Resource Strain: Not on file  Food Insecurity: Not on file  Transportation Needs: Not on file  Physical Activity: Not on file  Stress: Not on file  Social Connections: Not on file    Family History  Problem Relation Age of Onset  . Melanoma Maternal Grandmother   . Depression Mother   . Hypertension Father   . Stroke Father   . Lung cancer  Paternal Grandfather   . Skin cancer Paternal Grandmother     Health Maintenance  Topic Date Due  . COVID-19 Vaccine (1) Never done  . INFLUENZA VACCINE  05/15/2021  . PAP SMEAR-Modifier  06/17/2023  . TETANUS/TDAP  10/04/2030  . Hepatitis C Screening  Completed  . HIV Screening  Completed  . HPV VACCINES  Aged Out     ----------------------------------------------------------------------------------------------------------------------------------------------------------------------------------------------------------------- Physical Exam BP 101/67 (BP Location: Left Arm, Patient Position: Sitting, Cuff Size: Large)   Pulse 81   Temp (!) 97.4 F (36.3 C)   Ht 5' 1.81" (1.57 m)   Wt 220 lb 1.6 oz (99.8 kg)   SpO2 98%   BMI 40.50 kg/m   Physical Exam Constitutional:      Appearance: Normal appearance.  Eyes:     General: No scleral icterus. Cardiovascular:     Rate and Rhythm: Normal rate and regular rhythm.  Pulmonary:     Effort: Pulmonary effort is normal.     Breath sounds: Normal breath sounds.  Neurological:     General: No focal deficit present.     Mental Status: She is alert.  Psychiatric:        Mood and Affect: Mood normal.        Behavior: Behavior normal.     -------------------------------------------------------------------------------------------------------------------------------------------------------------------------------------------------------------------  Assessment and Plan  Class 2 obesity due to excess calories without serious comorbidity with body mass index (BMI) of 39.0 to 39.9 in adult She is currently breastfeeding.  Discussed medications that are not absorbed systemically including orlistat and plenity.  Will start plenity.  Rx sent to specialty pharmacy.  She will need to continue healthy diet and exercise for optimal weight loss.    Meds ordered this encounter  Medications  . DISCONTD: Carboxymeth-Cellulose-CitricAc (PLENITY  WELCOME KIT) CAPS    Sig: Take 2.25 g by mouth in the morning and at bedtime.    Dispense:  180 capsule    Refill:  2  . Carboxymeth-Cellulose-CitricAc (PLENITY WELCOME KIT) CAPS    Sig: Take 2.25 g by mouth in the morning and at bedtime.    Dispense:  180 capsule    Refill:  2    Return in about 6 months (around 09/06/2021) for weight management. .    This visit occurred during the SARS-CoV-2 public health emergency.  Safety protocols were in place, including screening questions prior to the visit, additional usage of staff PPE, and extensive cleaning of exam room while observing appropriate contact time as indicated for disinfecting solutions.

## 2021-03-06 NOTE — Patient Instructions (Addendum)
Let's try plenity for weight loss.  I have sent this to a specialty [facility] Follow up with me in about 6-8 weeks.

## 2021-03-06 NOTE — Assessment & Plan Note (Addendum)
She is currently breastfeeding.  Discussed medications that are not absorbed systemically including orlistat and plenity.  Will start plenity.  Rx sent to specialty pharmacy.  She will need to continue healthy diet and exercise for optimal weight loss.

## 2021-03-07 ENCOUNTER — Encounter: Payer: Self-pay | Admitting: Family Medicine

## 2021-03-07 ENCOUNTER — Other Ambulatory Visit: Payer: Self-pay | Admitting: Family Medicine

## 2021-03-07 MED ORDER — ORLISTAT 120 MG PO CAPS: 120.0000 mg | ORAL_CAPSULE | Freq: Three times a day (TID) | ORAL | 2 refills | Status: DC

## 2021-03-14 ENCOUNTER — Other Ambulatory Visit: Payer: Self-pay

## 2021-03-14 DIAGNOSIS — F419 Anxiety disorder, unspecified: Secondary | ICD-10-CM

## 2021-09-06 ENCOUNTER — Ambulatory Visit: Payer: BC Managed Care – PPO | Admitting: Family Medicine

## 2021-09-11 ENCOUNTER — Ambulatory Visit: Payer: BC Managed Care – PPO | Admitting: Family Medicine

## 2021-09-26 ENCOUNTER — Telehealth: Payer: Self-pay

## 2021-09-26 NOTE — Telephone Encounter (Signed)
Medication: orlistat (XENICAL) 120 MG capsule Prior authorization determination received Medication has been approved Approval dates: 08/27/2021-09/26/2022  Patient aware via: MyChart Pharmacy aware: Yes Provider aware via this encounter

## 2021-09-26 NOTE — Telephone Encounter (Signed)
Medication: orlistat (XENICAL) 120 MG capsule Prior authorization submitted via CoverMyMeds on 09/26/2021 [place] submission pending

## 2021-12-29 ENCOUNTER — Ambulatory Visit: Payer: BC Managed Care – PPO

## 2022-01-09 ENCOUNTER — Encounter: Payer: Self-pay | Admitting: Family Medicine

## 2022-01-15 ENCOUNTER — Ambulatory Visit (INDEPENDENT_AMBULATORY_CARE_PROVIDER_SITE_OTHER): Payer: BC Managed Care – PPO | Admitting: Obstetrics & Gynecology

## 2022-01-15 ENCOUNTER — Other Ambulatory Visit (HOSPITAL_COMMUNITY)
Admission: RE | Admit: 2022-01-15 | Discharge: 2022-01-15 | Disposition: A | Discharge status: Home / Self Care | Payer: BC Managed Care – PPO | Source: Ambulatory Visit | Attending: Obstetrics & Gynecology | Admitting: Obstetrics & Gynecology

## 2022-01-15 ENCOUNTER — Encounter: Payer: Self-pay | Admitting: Obstetrics & Gynecology

## 2022-01-15 VITALS — BP 108/73 | HR 91 | Resp 16 | Ht 61.0 in | Wt 216.0 lb

## 2022-01-15 DIAGNOSIS — Z01419 Encounter for gynecological examination (general) (routine) without abnormal findings: Secondary | ICD-10-CM | POA: Insufficient documentation

## 2022-01-15 NOTE — Progress Notes (Signed)
? ? ?GYNECOLOGY ANNUAL PREVENTATIVE CARE ENCOUNTER NOTE ? ?History:    ? Allison Oconnell is a 31 y.o. [phone] female here for a routine annual gynecologic exam.  Current complaints: none.   Denies abnormal vaginal bleeding, discharge, pelvic pain, problems with intercourse or other gynecologic concerns.  ?  ?Gynecologic History ?Patient's last menstrual period was 12/30/2021. ?Contraception: vasectomy ?Last Pap: 06/16/2020. Result was normal ? ? ?Obstetric History ?OB History  ?Gravida Para Term Preterm AB Living  ?6 5 3 1 1 3   ?SAB IAB Ectopic Multiple Live Births  ?1     0 3  ?  ?# Outcome Date GA Lbr Len/2nd Weight Sex Delivery Anes PTL Lv  ?6 Term 12/20/20 [redacted]w[redacted]d  6 lb 14.4 oz (3.13 kg) M CS-LTranv Spinal  LIV  ?5 SAB           ?4 Preterm           ?3 Para         FD  ?2 Term           ?1 Term           ? ? ?Past Medical History:  ?Diagnosis Date  ? Bicornuate uterus   ? Depression   ? Morbid obesity (HCC)   ? ? ?Past Surgical History:  ?Procedure Laterality Date  ? CESAREAN SECTION N/A 12/20/2020  ? Procedure: CESAREAN SECTION;  Surgeon: 02/19/2021, DO;  Location: [facility];  Service: Obstetrics;  Laterality: N/A;  ? GALLBLADDER SURGERY    ? may 2009  ? ? ?Current Outpatient Medications on File Prior to Visit  ?Medication Sig Dispense Refill  ? orlistat (XENICAL) 120 MG capsule Take 1 capsule (120 mg total) by mouth 3 (three) times daily with meals. 90 capsule 2  ? sertraline (ZOLOFT) 50 MG tablet Take 1 tablet (50 mg total) by mouth daily. 31 tablet 0  ? ?No current facility-administered medications on file prior to visit.  ? ? ?Allergies  ?Allergen Reactions  ? Sulfa Antibiotics Rash  ? ? ?Social History:  reports that she has never smoked. She has never used smokeless tobacco. She reports that she does not drink alcohol and does not use drugs. ? ?Family History  ?Problem Relation Age of Onset  ? Melanoma Maternal Grandmother   ? Depression Mother   ? Hypertension Father   ? Stroke Father   ? Lung cancer  Paternal Grandfather   ? Skin cancer Paternal Grandmother   ? ? ?The following portions of the patient's history were reviewed and updated as appropriate: allergies, current medications, past family history, past medical history, past social history, past surgical history and problem list. ? ?Review of Systems ?Pertinent items noted in HPI and remainder of comprehensive ROS otherwise negative. ? ?Physical Exam:  ?Resp 16   Ht 5\' 1"  (1.549 m)   Wt 216 lb (98 kg)   LMP 12/30/2021   Breastfeeding Yes   BMI 40.81 kg/m?  ?CONSTITUTIONAL: Well-developed, well-nourished female in no acute distress.  ?HENT:  Normocephalic, atraumatic, External right and left ear normal.  ?EYES: Conjunctivae and EOM are normal. Pupils are equal, round, and reactive to light. No scleral icterus.  ?NECK: Normal range of motion, supple, no masses.  Normal thyroid.  ?SKIN: Skin is warm and dry. No rash noted. Not diaphoretic. No erythema. No pallor. ?MUSCULOSKELETAL: Normal range of motion. No tenderness.  No cyanosis, clubbing, or edema. ?NEUROLOGIC: Alert and oriented to person, place, and time. Normal reflexes, muscle tone  coordination.  ?PSYCHIATRIC: Normal mood and affect. Normal behavior. Normal judgment and thought content. ?CARDIOVASCULAR: Normal heart rate noted, regular rhythm ?RESPIRATORY: Clear to auscultation bilaterally. Effort and breath sounds normal, no problems with respiration noted. ?BREASTS: Symmetric in size. No masses, tenderness, skin changes, nipple drainage, or lymphadenopathy bilaterally. Performed in the presence of a chaperone. ?ABDOMEN: Soft, no distention noted.  No tenderness, rebound or guarding.  ?PELVIC: Normal appearing external genitalia and urethral meatus; normal appearing vaginal mucosa and cervix.  No abnormal vaginal discharge noted.  Pap smear obtained.  Normal uterine size, no other palpable masses, no uterine or adnexal tenderness.  Performed in the presence of a chaperone. ?  ?Assessment and  Plan:  ?  1. Well woman exam with routine gynecological exam ?- Cytology - PAP ?Will follow up results of pap smear and manage accordingly. ?Routine preventative health maintenance measures emphasized. ?Please refer to After Visit Summary for other counseling recommendations.  ?   ? ?Jaynie Collins, MD, FACOG ?Obstetrician Heritage manager, Faculty Practice ?Center for [employer], [facility] ? ?

## 2022-01-18 LAB — CYTOLOGY - PAP
Comment: NEGATIVE
Diagnosis: NEGATIVE
High risk HPV: NEGATIVE

## 2022-02-14 ENCOUNTER — Encounter: Payer: Self-pay | Admitting: Family Medicine

## 2022-03-14 ENCOUNTER — Encounter: Payer: Self-pay | Admitting: Family Medicine

## 2022-04-05 IMAGING — US US MFM OB DETAIL+14 WK
1 series · 13 of 28 positions shown · non-contrast
Comparison: none

[Series 1: us mfm ob detail+14 wk · 101 acquisitions, 13 frames shown]
[im 4/101]
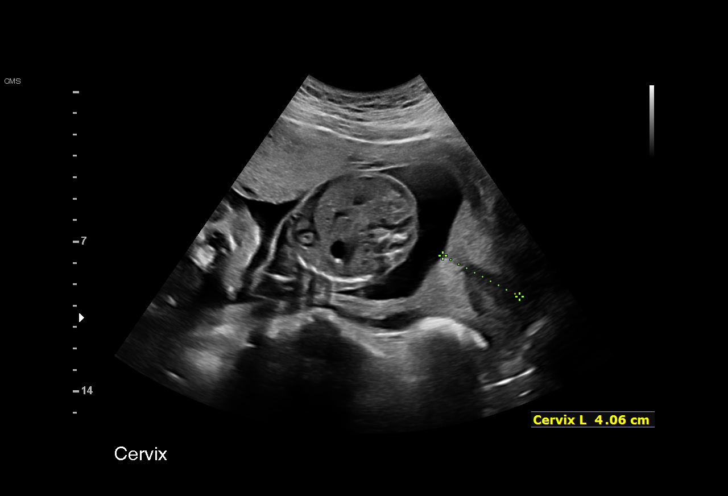
[im 12/101]
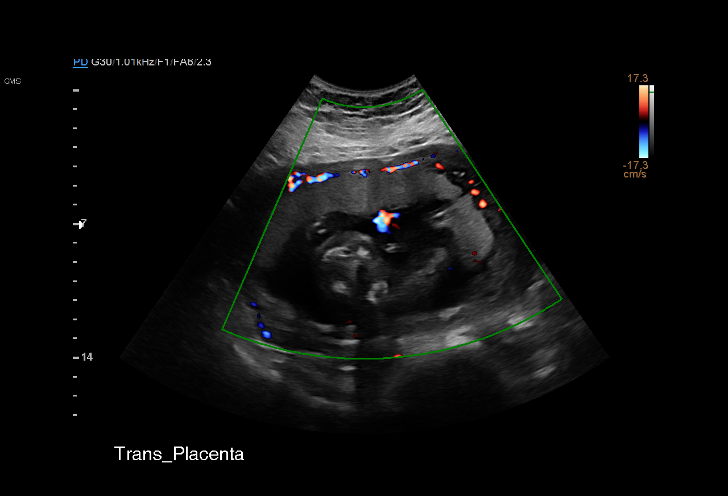
[im 19/101]
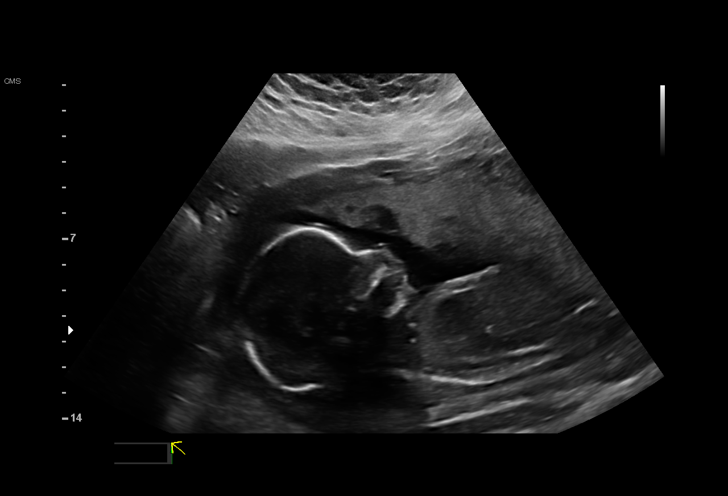
[im 26/101]
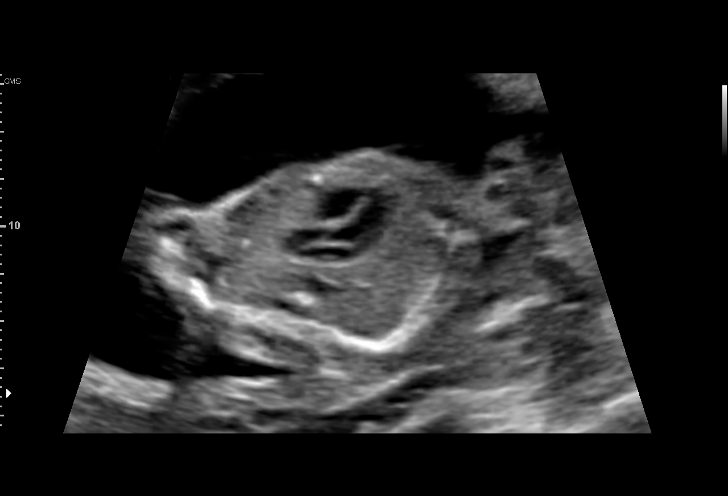
[im 34/101]
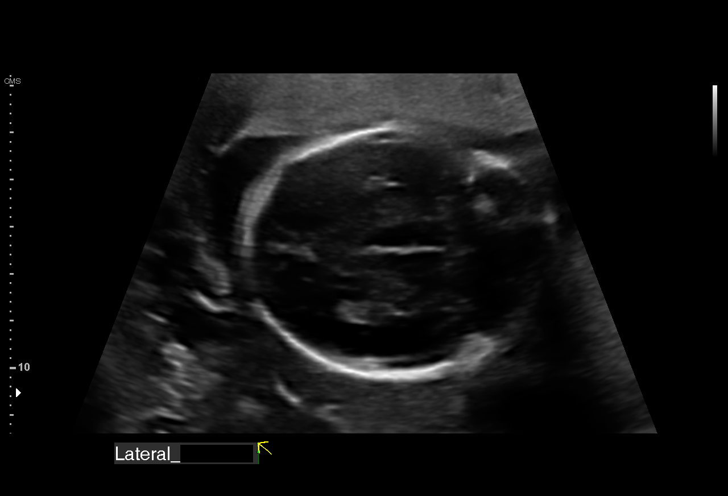
[im 41/101]
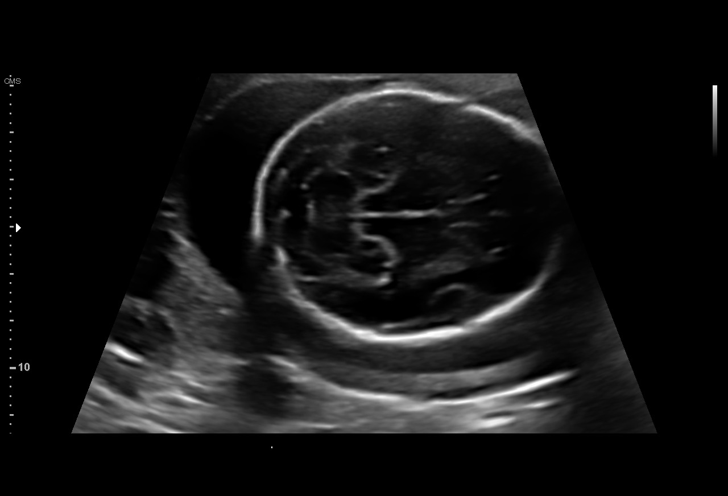
[im 52/101]
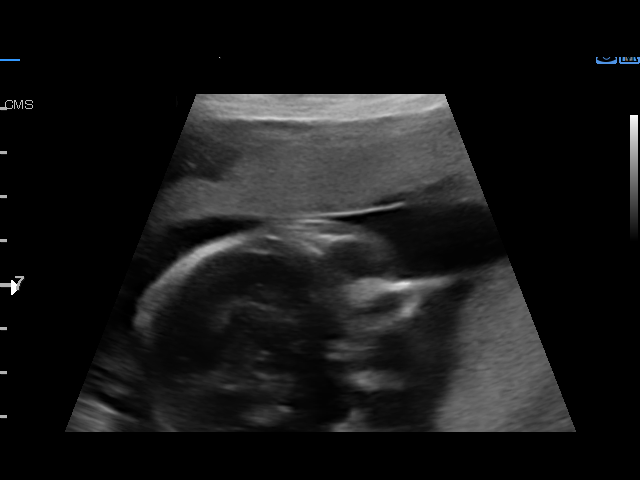
[im 60/101]
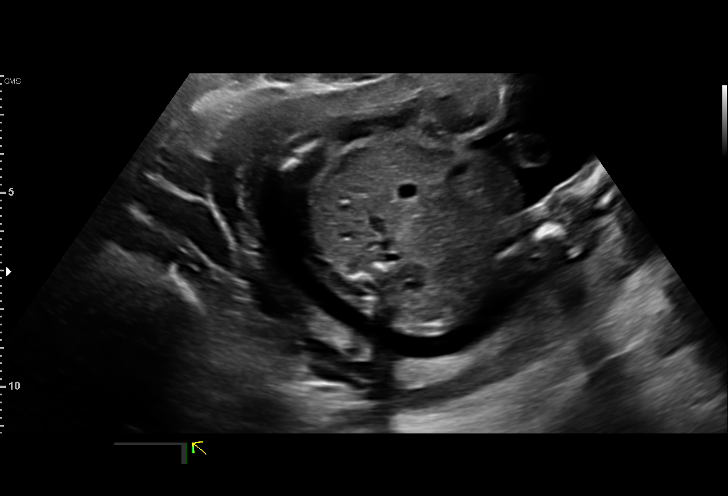
[im 67/101]
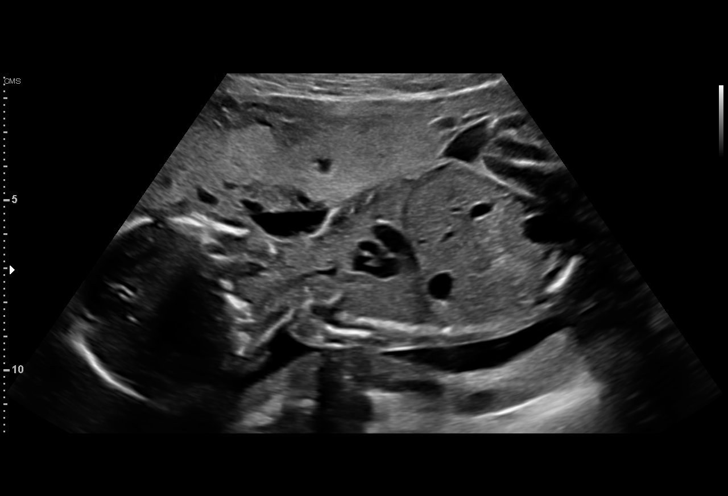
[im 75/101]
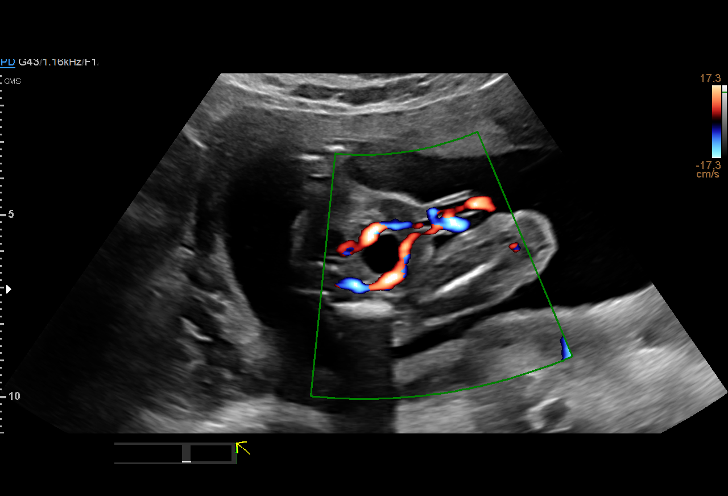
[im 82/101]
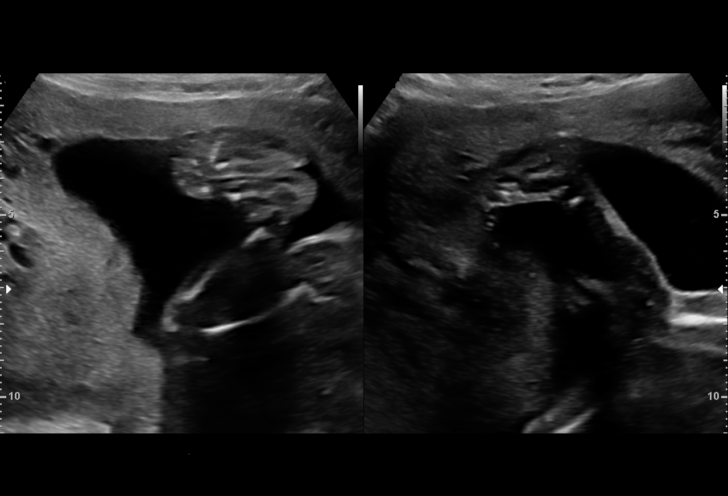
[im 89/101]
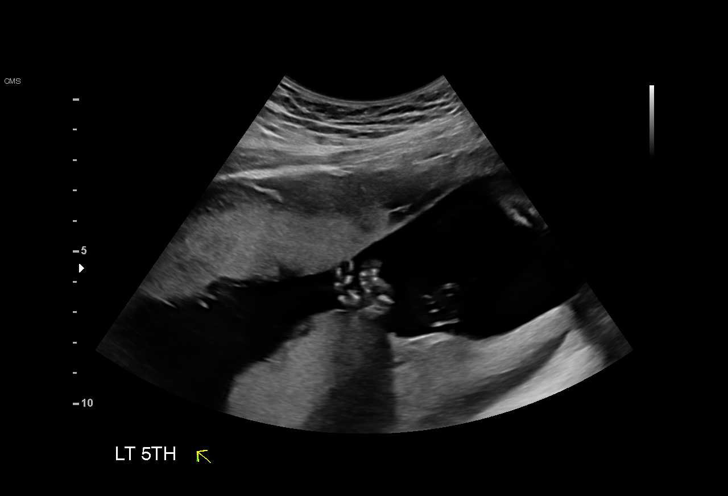
[im 97/101]
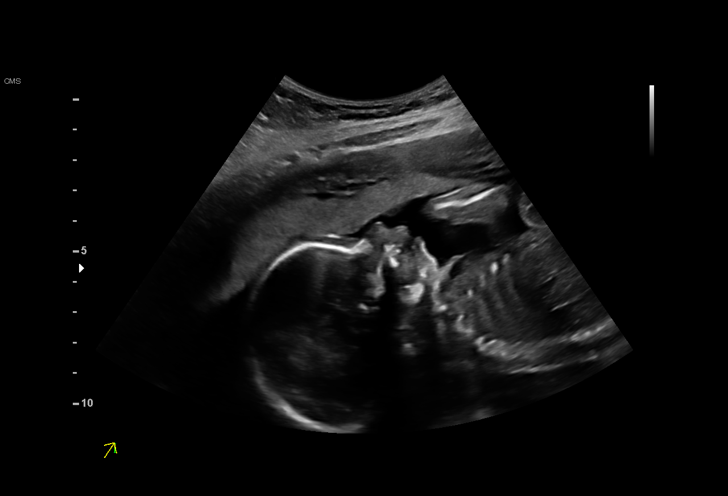

[13 of 28 positions shown; findings below may reference images not displayed]

ROZAIMAN

Indications

 Antenatal screening for malformations
 Poor obstetric history: Previous IUFD (26
 wks; Coxsackie per placenta path)
 Uterine abnormality during pregnancy
 (bicornuate)
 20 weeks gestation of pregnancy
Fetal Evaluation

 Num Of Fetuses:         1
 Fetal Heart Rate(bpm):  133
 Cardiac Activity:       Observed
 Presentation:           Cephalic
 Placenta:               Anterior
 P. Cord Insertion:      Visualized

 Amniotic Fluid
 AFI FV:      Within normal limits

                             Largest Pocket(cm)
                             6
Biometry

 BPD:      52.4  mm     G. Age:  21w 6d         86  %    CI:        75.04   %    70 - 86
                                                         FL/HC:      17.2   %    15.9 -
 HC:      191.9  mm     G. Age:  21w 3d         67  %    HC/AC:      1.17        1.06 -
 AC:      163.9  mm     G. Age:  21w 3d         63  %    FL/BPD:     63.2   %
 FL:       33.1  mm     G. Age:  20w 2d         25  %    FL/AC:      20.2   %    20 - 24
 HUM:      32.6  mm     G. Age:  21w 0d         50  %
 CER:      21.5  mm     G. Age:  20w 2d         45  %
 LV:        6.3  mm
 CM:        6.8  mm

 Est. FW:     395  gm    0 lb 14 oz      55  %
OB History

 Gravidity:    5         Term:   2        Prem:   1        SAB:   1
 TOP:          0       Ectopic:  0        Living: 2
Gestational Age

 LMP:           20w 6d        Date:  03/24/20                 EDD:   12/29/20
 U/S Today:     21w 2d                                        EDD:   12/26/20
 Best:          20w 6d     Det. By:  LMP  (03/24/20)          EDD:   12/29/20
Anatomy

 Cranium:               Appears normal         LVOT:                   Appears normal
 Cavum:                 Appears normal         Aortic Arch:            Appears normal
 Ventricles:            Appears normal         Ductal Arch:            Appears normal
 Choroid Plexus:        Appears normal         Diaphragm:              Appears normal
 Cerebellum:            Appears normal         Stomach:                Appears normal, left
                                                                       sided
 Posterior Fossa:       Appears normal         Abdomen:                Appears normal
 Nuchal Fold:           Appears normal         Abdominal Wall:         Appears nml (cord
                                                                       insert, abd wall)
 Face:                  Appears normal         Cord Vessels:           Appears normal (3
                        (orbits and profile)                           vessel cord)
 Lips:                  Appears normal         Kidneys:                Appear normal
 Palate:                Appears normal         Bladder:                Appears normal
 Thoracic:              Appears normal         Spine:                  Not well visualized
 Heart:                 Appears normal         Upper Extremities:      Appears normal
                        (4CH, axis, and
                        situs)
 RVOT:                  Appears normal         Lower Extremities:      Appears normal

 Other:  Heels/feet and open hands/5th digits visualized. Nasal bone
         visualized. VC, 3VV and 3VTV visualized. Technically difficult due to
         fetal position.
Cervix Uterus Adnexa

 Cervix
 Length:           3.97  cm.
 Normal appearance by transabdominal scan.

 Uterus
 No abnormality visualized.

 Right Ovary
 Within normal limits.

 Left Ovary
 Within normal limits.

 Cul De Sac
 No free fluid seen.
 Adnexa
 No abnormality visualized.
Impression

 G5 P2.  Patient gives history of stillbirth at 26 weeks
 gestation last year and placental histology apparently
 confirmed coxsackievirus infection.
 She is here for fetal anatomy scan.  She had opted not to
 screen for fetal aneuploidies.

 We performed fetal anatomy scan. No makers of
 aneuploidies or fetal structural defects are seen. Fetal
 biometry is consistent with her previously-established dates.
 Amniotic fluid is normal and good fetal activity is seen.
 Patient understands the limitations of ultrasound in detecting
 fetal anomalies.
 "MyChart":Patient does not want to know fetal sex/gender.
 We informed her that fetal sex/gender will be mentioned in
 ultrasound report and will be in "MyChart" that will be
 accessible to the patient .
Recommendations

 -An appointment was made for her to return in 4 weeks for
 completion of fetal anatomy.
 -Previous records (stillbirth) if available will help us decide on
 antenatal testing.
                 Solange, Fjoren

## 2022-07-04 IMAGING — US US MFM OB FOLLOW-UP
1 series · 14 of 28 positions shown · non-contrast
Comparison: none

[Series 1: us mfm ob follow-up · 53 acquisitions, 14 frames shown]
[im 2/53]
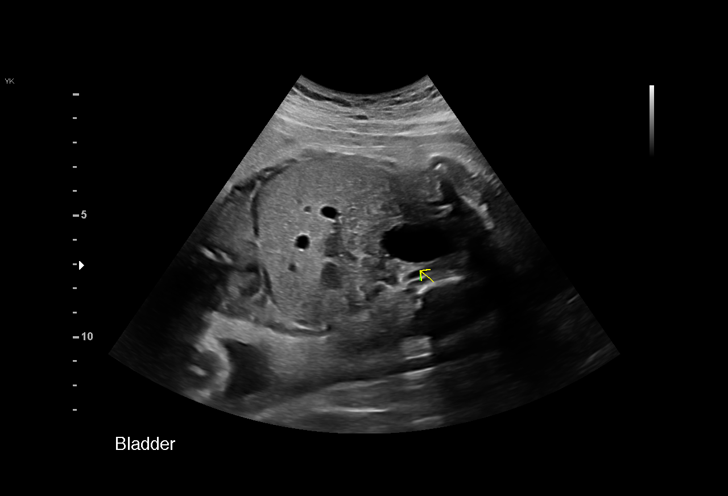
[im 6/53]
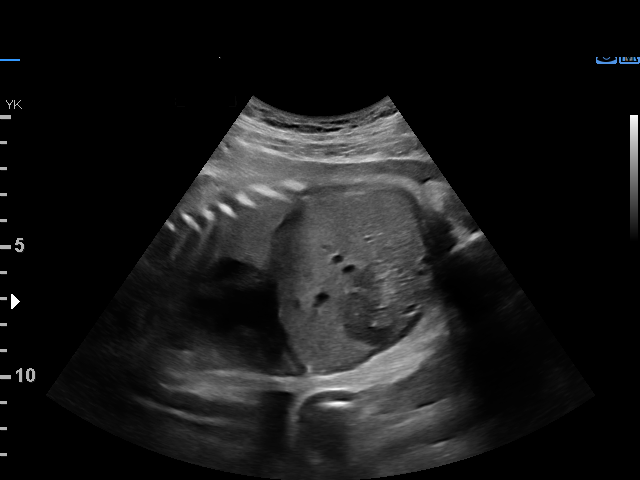
[im 10/53]
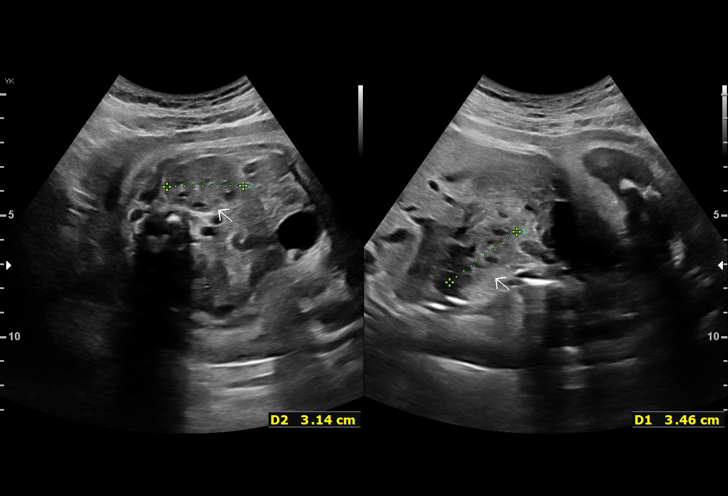
[im 14/53]
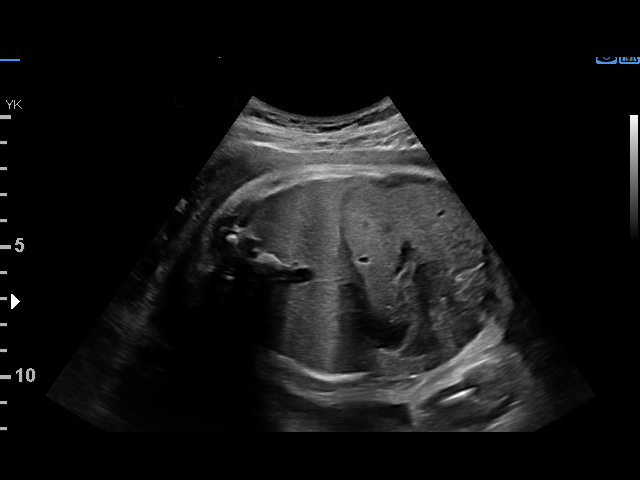
[im 18/53]
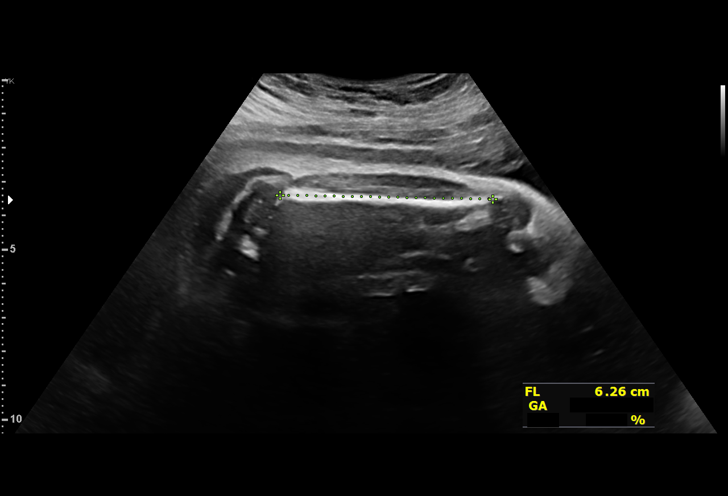
[im 22/53]
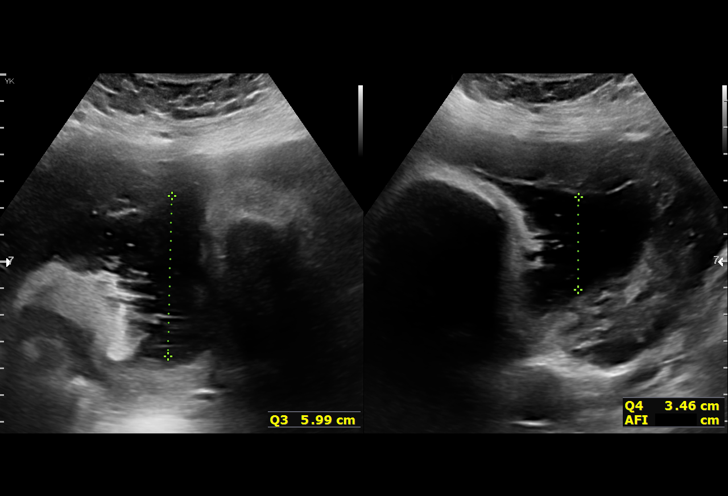
[im 26/53]
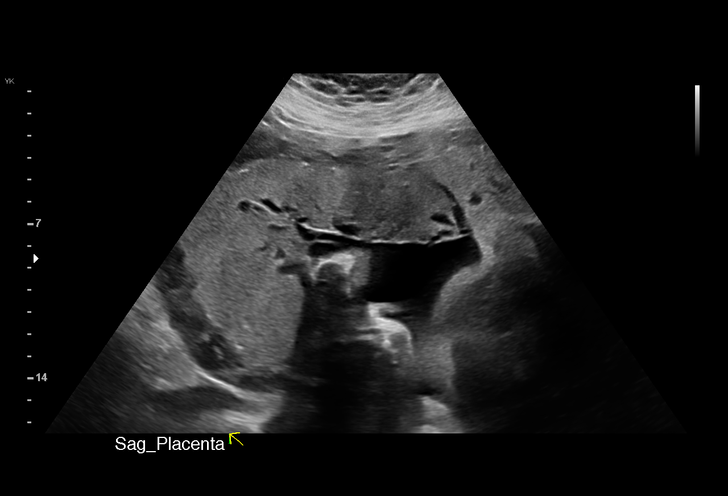
[im 29/53]
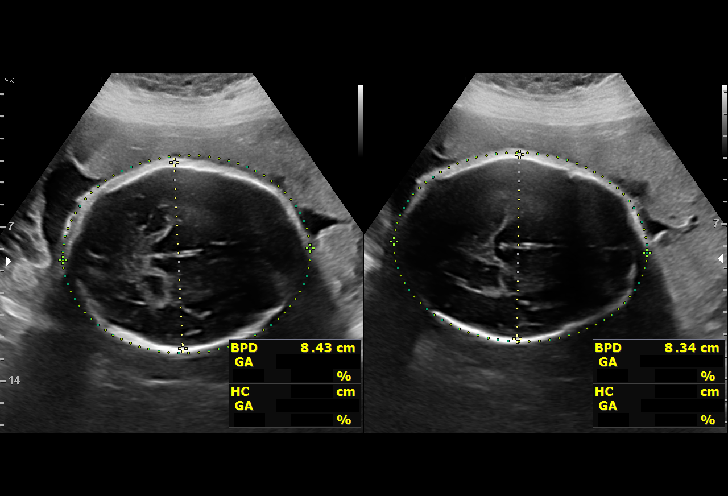
[im 33/53]
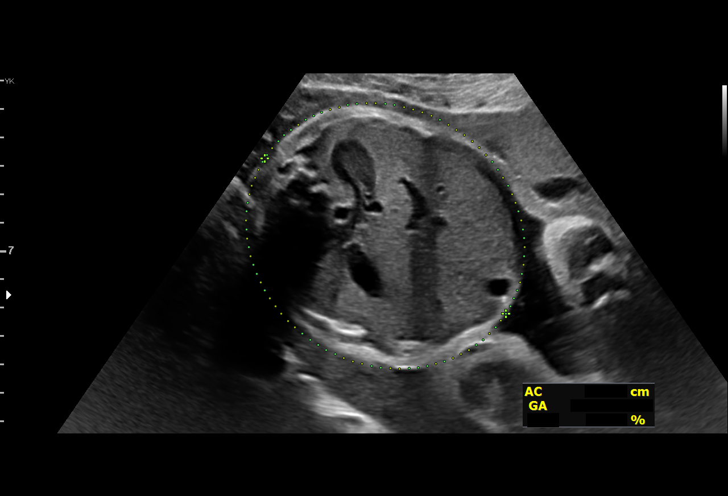
[im 37/53]
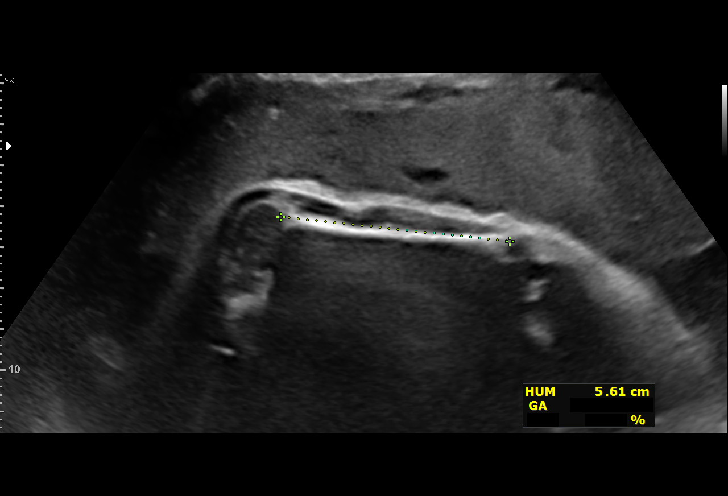
[im 41/53]
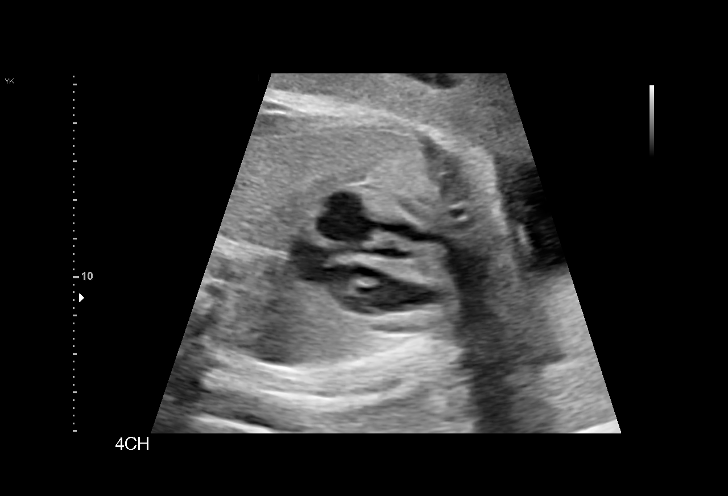
[im 45/53]
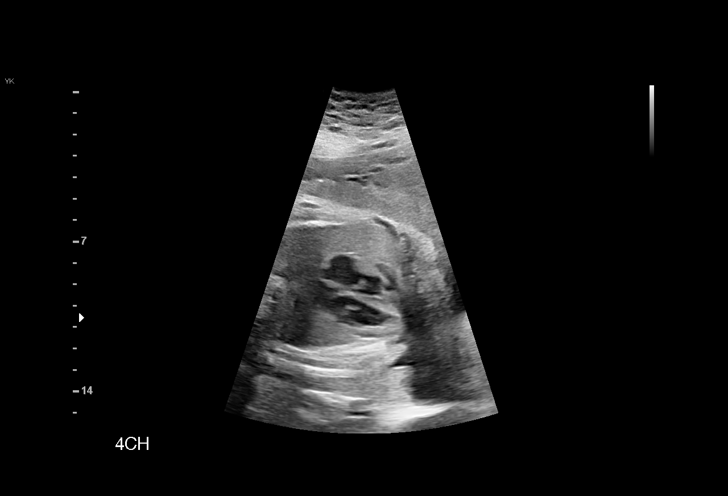
[im 49/53]
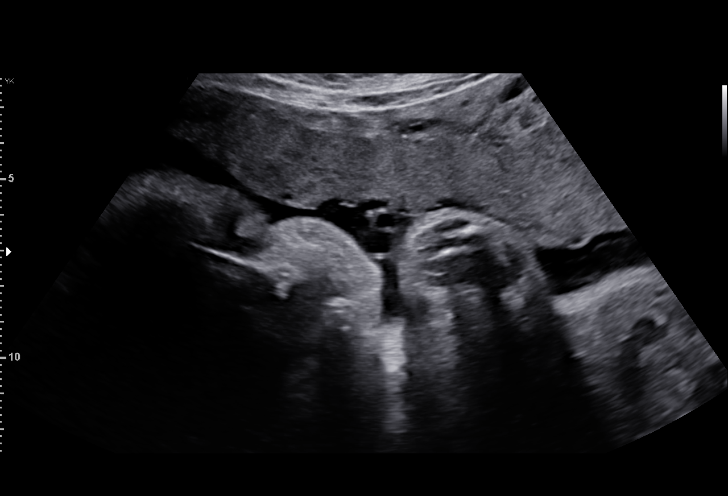
[im 53/53]
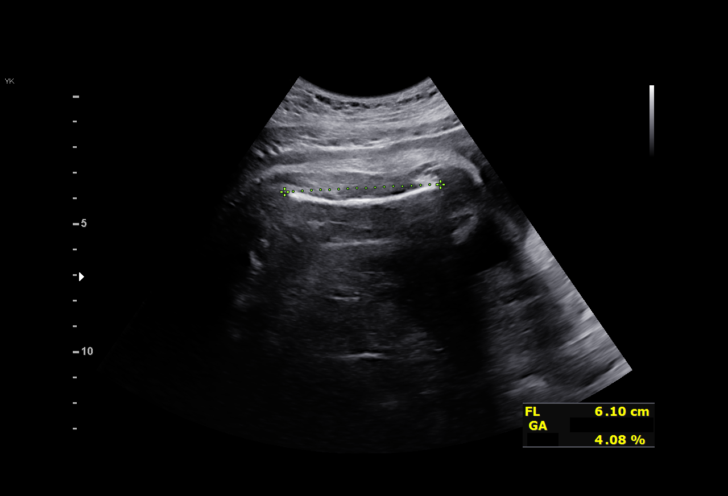

[14 of 28 positions shown; findings below may reference images not displayed]

Indications

 Poor obstetric history: Previous IUFD (26
 wks; Coxsackie per placenta path)
 Encounter for other antenatal screening
 follow-up
 Uterine abnormality during pregnancy
 (bicornuate)
 33 weeks gestation of pregnancy
Fetal Evaluation

 Num Of Fetuses:         1
 Fetal Heart Rate(bpm):  129
 Cardiac Activity:       Observed
 Presentation:           Breech
 Placenta:               Left lateral fundal
 P. Cord Insertion:      Previously Visualized

 Amniotic Fluid
 AFI FV:      Within normal limits

 AFI Sum(cm)     %Tile       Largest Pocket(cm)
 20.6            77

 RUQ(cm)       RLQ(cm)       LUQ(cm)        LLQ(cm)

Biometry

 BPD:      83.8  mm     G. Age:  33w 5d         47  %    CI:        70.34   %    70 - 86
                                                         FL/HC:      18.9   %    19.4 -
 HC:      318.6  mm     G. Age:  35w 6d         70  %    HC/AC:      1.05        0.96 -
 AC:       303   mm     G. Age:  34w 2d         69  %    FL/BPD:     72.0   %    71 - 87
 FL:       60.3  mm     G. Age:  31w 3d        2.4  %    FL/AC:      19.9   %    20 - 24
 HUM:      55.7  mm     G. Age:  32w 3d         31  %

 Est. FW:    1113  gm    4 lb 14 oz      38  %
OB History

 Gravidity:    5         Term:   2        Prem:   1        SAB:   1
 TOP:          0       Ectopic:  0        Living: 2
Gestational Age

 LMP:           33w 5d        Date:  03/24/20                 EDD:   12/29/20
 U/S Today:     33w 6d                                        EDD:   12/28/20
 Best:          33w 5d     Det. By:  LMP  (03/24/20)          EDD:   12/29/20
Anatomy

 Cranium:               Appears normal         Aortic Arch:            Previously seen
 Cavum:                 Appears normal         Ductal Arch:            Previously seen
 Ventricles:            Appears normal         Diaphragm:              Appears normal
 Choroid Plexus:        Previously seen        Stomach:                Appears normal, left
                                                                       sided
 Cerebellum:            Previously seen        Abdomen:                Previously seen
 Posterior Fossa:       Previously seen        Abdominal Wall:         Previously seen
 Nuchal Fold:           Not applicable (>20    Cord Vessels:           Previously seen
                        wks GA)
 Face:                  Profile previously     Kidneys:                Appear normal
                        seen
 Lips:                  Previously seen        Bladder:                Appears normal
 Thoracic:              Appears normal         Spine:                  Previously seen
 Heart:                 Appears normal         Upper Extremities:      Previously seen
                        (4CH, axis, and
                        situs)
 RVOT:                  Previously seen        Lower Extremities:      Previously seen
 LVOT:                  Previously seen

 Other:  Fetus appears to be a male. Technically difficult due to maternal
         habitus and fetal position.
Cervix Uterus Adnexa

 Cervix
 Not visualized (advanced GA >70wks)
Comments

 This patient was seen for a follow up growth scan due to a
 prior IUFD at 26 weeks most likely due to the coxsackievirus
 and a bicornuate uterus.  She denies any problems since her
 last exam.
 She was informed that the fetal growth and amniotic fluid
 level appears appropriate for her gestational age.
 The fetus is in the breech presentation today.
 As the fetal growth is within normal limits, no further exams
 were scheduled in our office.

## 2022-07-26 ENCOUNTER — Ambulatory Visit: Payer: BC Managed Care – PPO | Admitting: Family Medicine

## 2022-08-02 ENCOUNTER — Ambulatory Visit: Payer: BC Managed Care – PPO | Admitting: Family Medicine

## 2022-08-08 ENCOUNTER — Encounter: Payer: Self-pay | Admitting: Family Medicine

## 2022-08-08 ENCOUNTER — Ambulatory Visit (INDEPENDENT_AMBULATORY_CARE_PROVIDER_SITE_OTHER): Payer: BC Managed Care – PPO | Admitting: Family Medicine

## 2022-08-08 DIAGNOSIS — E669 Obesity, unspecified: Secondary | ICD-10-CM

## 2022-08-08 MED ORDER — PHENTERMINE HCL 37.5 MG PO CAPS: 37.5000 mg | ORAL_CAPSULE | ORAL | 0 refills | Status: DC

## 2022-08-08 NOTE — Assessment & Plan Note (Signed)
She has had good success and tolerance to phentermine previously.  We'll restart this at 37.5mg  daily.  Discussed importance of diet and lifestyle change for most effective response and to be able to maintain weight loss once off the medication.  F/u in 6 weeks.

## 2022-08-08 NOTE — Patient Instructions (Signed)

## 2022-08-08 NOTE — Progress Notes (Signed)
Allison Oconnell - 31 y.o. female MRN [id number]  Date of birth: 1990-12-29  Subjective Chief Complaint  Patient presents with   Weight Loss    HPI Allison Oconnell is a 31 y.o. female here today to discuss weight management.   She has lost about 20 lbs since her pregnancy.  She is pretty active with her children but does not participate in any formal exercise.  She has not made any significant changes to her diet.  She would like to try phentermine.  She has taken this before and had good success with this.  She was able to maintain weight loss previously after stopping until becoming pregnant.  She did not have side effects with medication in the past including changes to mood or insomnia.   ROS:  A comprehensive ROS was completed and negative except as noted per HPI  Allergies  Allergen Reactions   Sulfa Antibiotics Rash    Past Medical History:  Diagnosis Date   Bicornuate uterus    Depression    Morbid obesity (HCC)     Past Surgical History:  Procedure Laterality Date   CESAREAN SECTION N/A 12/20/2020   Procedure: CESAREAN SECTION;  Surgeon: Levie Heritage, DO;  Location: [facility];  Service: Obstetrics;  Laterality: N/A;   GALLBLADDER SURGERY     may 2009    Social History   Socioeconomic History   Marital status: Married    Spouse name: Not on file   Number of children: Not on file   Years of education: Not on file   Highest education level: Not on file  Occupational History   Occupation: homemaker  Tobacco Use   Smoking status: Never   Smokeless tobacco: Never  Vaping Use   Vaping Use: Never used  Substance and Sexual Activity   Alcohol use: Never   Drug use: Never   Sexual activity: Yes    Birth control/protection: None, Other-see comments    Comment: Vasectomy  Other Topics Concern   Not on file  Social History Narrative   Not on file   Social Determinants of Health   Financial Resource Strain: Not on file  Food Insecurity: Not on file   Transportation Needs: Not on file  Physical Activity: Not on file  Stress: Not on file  Social Connections: Not on file    Family History  Problem Relation Age of Onset   Melanoma Maternal Grandmother    Depression Mother    Hypertension Father    Stroke Father    Lung cancer Paternal Grandfather    Skin cancer Paternal Grandmother     Health Maintenance  Topic Date Due   COVID-19 Vaccine (1) 11/15/2022 (Originally 02/[birth year]/1991)   INFLUENZA VACCINE  01/13/2023 (Originally 05/15/2022)   PAP SMEAR-Modifier  01/15/2025   TETANUS/TDAP  10/04/2030   Hepatitis C Screening  Completed   HIV Screening  Completed   HPV VACCINES  Aged Out     ----------------------------------------------------------------------------------------------------------------------------------------------------------------------------------------------------------------- Physical Exam BP 108/69 (BP Location: Left Arm, Patient Position: Sitting, Cuff Size: Large)   Pulse 82   Ht 5\' 1"  (1.549 m)   Wt 214 lb 3.2 oz (97.2 kg)   SpO2 98%   BMI 40.47 kg/m   Physical Exam Constitutional:      Appearance: Normal appearance.  Eyes:     General: No scleral icterus. Cardiovascular:     Rate and Rhythm: Normal rate and regular rhythm.  Pulmonary:     Effort: Pulmonary effort is normal.  Breath sounds: Normal breath sounds.  Musculoskeletal:     Cervical back: Neck supple.  Neurological:     Mental Status: She is alert.  Psychiatric:        Mood and Affect: Mood normal.        Behavior: Behavior normal.     ------------------------------------------------------------------------------------------------------------------------------------------------------------------------------------------------------------------- Assessment and Plan  Obesity (BMI 35.0-39.9 without comorbidity) She has had good success and tolerance to phentermine previously.  We'll restart this at 37.5mg  daily.  Discussed importance  of diet and lifestyle change for most effective response and to be able to maintain weight loss once off the medication.  F/u in 6 weeks.    Meds ordered this encounter  Medications   phentermine 37.5 MG capsule    Sig: Take 1 capsule (37.5 mg total) by mouth every morning.    Dispense:  90 capsule    Refill:  0    Return in about 6 weeks (around 09/19/2022) for Weight management.    This visit occurred during the SARS-CoV-2 public health emergency.  Safety protocols were in place, including screening questions prior to the visit, additional usage of staff PPE, and extensive cleaning of exam room while observing appropriate contact time as indicated for disinfecting solutions.

## 2022-09-03 ENCOUNTER — Telehealth: Payer: Self-pay

## 2022-09-03 NOTE — Telephone Encounter (Signed)
Initiated Prior authorization BUL:AGTXMIWOEHO HCl 37.5MG  capsules Via: Covermymeds Case/Key:[id number]  Status: approved  as of 09/03/22 Reason:;Coverage Start Date:07/16/2022;Coverage End Date:11/13/2022; Notified Pt via: Mychart

## 2022-09-18 ENCOUNTER — Ambulatory Visit: Payer: BC Managed Care – PPO | Admitting: Family Medicine

## 2022-09-26 ENCOUNTER — Encounter: Payer: Self-pay | Admitting: Family Medicine

## 2022-09-26 ENCOUNTER — Ambulatory Visit: Payer: BC Managed Care – PPO | Admitting: Family Medicine

## 2022-09-26 VITALS — BP 102/78 | HR 112 | Ht 61.0 in | Wt 201.0 lb

## 2022-09-26 DIAGNOSIS — E669 Obesity, unspecified: Secondary | ICD-10-CM

## 2022-09-26 MED ORDER — PHENTERMINE HCL 37.5 MG PO CAPS: 37.5000 mg | ORAL_CAPSULE | ORAL | 0 refills | Status: DC

## 2022-09-26 NOTE — Assessment & Plan Note (Signed)
She is doing well on phentermine at current strength.  She has had fairly good success with this so far.  Will continue this for another additional month.

## 2022-09-26 NOTE — Progress Notes (Signed)
Allison Oconnell - 31 y.o. female MRN [id number]  Date of birth: Mar 13, 1991  Subjective Chief Complaint  Patient presents with   Weight Loss    HPI Cambell Oconnell is a 31 y.o. female here today for follow-up visit.  She reports she is doing quite well.  She is continuing on phentermine for a couple months.  Weight is down about 15 pounds since initially starting.  No issues with tolerance.  She feels like this is working well for appetite suppression.  Her goal weight is around 170 and 180.  She does feel like her diet is pretty good.  Her children keep her pretty active.  ROS:  A comprehensive ROS was completed and negative except as noted per HPI  Allergies  Allergen Reactions   Sulfa Antibiotics Rash    Past Medical History:  Diagnosis Date   Bicornuate uterus    Depression    Morbid obesity (HCC)     Past Surgical History:  Procedure Laterality Date   CESAREAN SECTION N/A 12/20/2020   Procedure: CESAREAN SECTION;  Surgeon: Levie Heritage, DO;  Location: [facility];  Service: Obstetrics;  Laterality: N/A;   GALLBLADDER SURGERY     may 2009    Social History   Socioeconomic History   Marital status: Married    Spouse name: Not on file   Number of children: Not on file   Years of education: Not on file   Highest education level: Not on file  Occupational History   Occupation: homemaker  Tobacco Use   Smoking status: Never   Smokeless tobacco: Never  Vaping Use   Vaping Use: Never used  Substance and Sexual Activity   Alcohol use: Never   Drug use: Never   Sexual activity: Yes    Birth control/protection: None, Other-see comments    Comment: Vasectomy  Other Topics Concern   Not on file  Social History Narrative   Not on file   Social Determinants of Health   Financial Resource Strain: Not on file  Food Insecurity: Not on file  Transportation Needs: Not on file  Physical Activity: Not on file  Stress: Not on file  Social Connections: Not on file     Family History  Problem Relation Age of Onset   Melanoma Maternal Grandmother    Depression Mother    Hypertension Father    Stroke Father    Lung cancer Paternal Grandfather    Skin cancer Paternal Grandmother     Health Maintenance  Topic Date Due   COVID-19 Vaccine (1) 11/15/2022 (Originally 02/22/1991)   INFLUENZA VACCINE  01/13/2023 (Originally 05/15/2022)   PAP SMEAR-Modifier  01/15/2025   DTaP/Tdap/Td (2 - Td or Tdap) 10/04/2030   Hepatitis C Screening  Completed   HIV Screening  Completed   HPV VACCINES  Aged Out     ----------------------------------------------------------------------------------------------------------------------------------------------------------------------------------------------------------------- Physical Exam BP 102/78 (BP Location: Right Arm, Patient Position: Sitting, Cuff Size: Large)   Pulse (!) 112   Ht 5\' 1"  (1.549 m)   Wt 201 lb (91.2 kg)   SpO2 99%   BMI 37.98 kg/m   Physical Exam Constitutional:      Appearance: Normal appearance.  HENT:     Head: Normocephalic and atraumatic.  Neurological:     Mental Status: She is alert.  Psychiatric:        Mood and Affect: Mood normal.        Behavior: Behavior normal.     ------------------------------------------------------------------------------------------------------------------------------------------------------------------------------------------------------------------- Assessment and Plan  Obesity (BMI  35.0-39.9 without comorbidity) She is doing well on phentermine at current strength.  She has had fairly good success with this so far.  Will continue this for another additional month.   Meds ordered this encounter  Medications   phentermine 37.5 MG capsule    Sig: Take 1 capsule (37.5 mg total) by mouth every morning.    Dispense:  30 capsule    Refill:  0    Return in about 10 weeks (around 12/05/2022) for F/u Wt. mgmt.    This visit occurred during the  SARS-CoV-2 public health emergency.  Safety protocols were in place, including screening questions prior to the visit, additional usage of staff PPE, and extensive cleaning of exam room while observing appropriate contact time as indicated for disinfecting solutions.

## 2023-02-13 ENCOUNTER — Other Ambulatory Visit: Payer: Self-pay | Admitting: Family Medicine

## 2023-02-13 DIAGNOSIS — E669 Obesity, unspecified: Secondary | ICD-10-CM

## 2023-02-13 DIAGNOSIS — E6609 Other obesity due to excess calories: Secondary | ICD-10-CM

## 2023-02-14 ENCOUNTER — Other Ambulatory Visit: Payer: Self-pay | Admitting: *Deleted

## 2023-02-14 DIAGNOSIS — F419 Anxiety disorder, unspecified: Secondary | ICD-10-CM

## 2023-02-14 MED ORDER — SERTRALINE HCL 50 MG PO TABS: 50.0000 mg | ORAL_TABLET | Freq: Every day | ORAL | 1 refills | Status: DC

## 2023-02-15 ENCOUNTER — Other Ambulatory Visit: Payer: Self-pay | Admitting: Family Medicine

## 2023-02-15 DIAGNOSIS — Z6839 Body mass index (BMI) 39.0-39.9, adult: Secondary | ICD-10-CM

## 2023-02-18 NOTE — Telephone Encounter (Signed)
Spoke with patient, patient unable to schedule at the time, and stated that she will call back to schedule appointment, thanks.

## 2023-02-18 NOTE — Telephone Encounter (Signed)
Please contact the patient to schedule weight loss follow-up appt. Past due since 2/21. Thanks

## 2023-04-18 ENCOUNTER — Other Ambulatory Visit: Payer: Self-pay | Admitting: Obstetrics & Gynecology

## 2023-04-18 DIAGNOSIS — F419 Anxiety disorder, unspecified: Secondary | ICD-10-CM

## 2023-04-26 ENCOUNTER — Other Ambulatory Visit: Payer: Self-pay | Admitting: Obstetrics & Gynecology

## 2023-04-26 DIAGNOSIS — F419 Anxiety disorder, unspecified: Secondary | ICD-10-CM

## 2023-04-30 ENCOUNTER — Other Ambulatory Visit: Payer: Self-pay | Admitting: Obstetrics & Gynecology

## 2023-04-30 DIAGNOSIS — F419 Anxiety disorder, unspecified: Secondary | ICD-10-CM

## 2023-05-03 ENCOUNTER — Encounter: Payer: Self-pay | Admitting: Family Medicine

## 2023-05-04 ENCOUNTER — Encounter: Payer: Self-pay | Admitting: Obstetrics and Gynecology

## 2023-05-07 ENCOUNTER — Encounter: Payer: Self-pay | Admitting: Family Medicine

## 2023-05-07 ENCOUNTER — Telehealth (INDEPENDENT_AMBULATORY_CARE_PROVIDER_SITE_OTHER): Payer: BC Managed Care – PPO | Admitting: Family Medicine

## 2023-05-07 VITALS — BP 105/78 | HR 95 | Ht 61.0 in | Wt 194.0 lb

## 2023-05-07 DIAGNOSIS — F32A Depression, unspecified: Secondary | ICD-10-CM

## 2023-05-07 DIAGNOSIS — F418 Other specified anxiety disorders: Secondary | ICD-10-CM

## 2023-05-07 DIAGNOSIS — F419 Anxiety disorder, unspecified: Secondary | ICD-10-CM

## 2023-05-07 MED ORDER — SERTRALINE HCL 50 MG PO TABS: 50.0000 mg | ORAL_TABLET | Freq: Every day | ORAL | 3 refills | Status: DC

## 2023-05-07 NOTE — Assessment & Plan Note (Signed)
She been on sertraline for several years and reports that this is working pretty well for her.  Has been off for about a week and has noticed the effects of this.  Will restart at previous dose of 50 mg daily.

## 2023-05-07 NOTE — Progress Notes (Signed)
Allison Oconnell - 31 y.o. female MRN [id number]  Date of birth: January 14, 1991   This visit type was conducted due to national recommendations for restrictions regarding the COVID-19 Pandemic (e.g. social distancing).  This format is felt to be most appropriate for this patient at this time.  All issues noted in this document were discussed and addressed.  No physical exam was performed (except for noted visual exam findings with Video Visits).  I discussed the limitations of evaluation and management by telemedicine and the availability of in person appointments. The patient expressed understanding and agreed to proceed.  I connected withNAME@ on 05/07/23 at 11:30 AM EDT by a video enabled telemedicine application and verified that I am speaking with the correct person using two identifiers.  Present at visit: Everrett Coombe, DO [name]   Patient Location: Home [address] [place] [address]   Provider location:   [facility]  Chief Complaint  Patient presents with   Mood    HPI  Allison Oconnell is a 32 y.o. female who presents via audio/video conferencing for a telehealth visit today.  She is needing a refill on sertraline.  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  Her OB/GYN was managing this however would prefer to have her PCP take this over.  She has been out of medication for about a week.  She is also in the process of moving and had her menstrual cycle this week so her mood has not been very great.  She has tolerated sertraline well at current strength.  ROS:  A comprehensive ROS was completed and negative except as noted per HPI  Past Medical History:  Diagnosis Date   Bicornuate uterus    Depression    Morbid obesity (HCC)     Past Surgical History:  Procedure Laterality Date   CESAREAN SECTION N/A 12/20/2020   Procedure: CESAREAN SECTION;  Surgeon: Levie Heritage, DO;  Location: [facility];  Service:  Obstetrics;  Laterality: N/A;   GALLBLADDER SURGERY     may 2009    Family History  Problem Relation Age of Onset   Melanoma Maternal Grandmother    Depression Mother    Hypertension Father    Stroke Father    Lung cancer Paternal Grandfather    Skin cancer Paternal Grandmother     Social History   Socioeconomic History   Marital status: Married    Spouse name: Not on file   Number of children: Not on file   Years of education: Not on file   Highest education level: Not on file  Occupational History   Occupation: homemaker  Tobacco Use   Smoking status: Never   Smokeless tobacco: Never  Vaping Use   Vaping status: Never Used  Substance and Sexual Activity   Alcohol use: Never   Drug use: Never   Sexual activity: Yes    Birth control/protection: None, Other-see comments    Comment: Vasectomy  Other Topics Concern   Not on file  Social History Narrative   Not on file   Social Determinants of Health   Financial Resource Strain: Not on file  Food Insecurity: Not on file  Transportation Needs: Not on file  Physical Activity: Not on file  Stress: Not on file  Social Connections: Not on file  Intimate Partner Violence: Not on file     Current Outpatient Medications:    phentermine 37.5 MG capsule, Take 1 capsule by  mouth in the morning, Disp: 30 capsule, Rfl: 0   sertraline (ZOLOFT) 50 MG tablet, Take 1 tablet (50 mg total) by mouth daily., Disp: 90 tablet, Rfl: 3  EXAM:  VITALS per patient if applicable: BP 105/78   Pulse 95   Ht 5\' 1"  (1.549 m)   Wt 194 lb (88 kg)   BMI 36.66 kg/m   GENERAL: alert, oriented, appears well and in no acute distress  HEENT: atraumatic, conjunttiva clear, no obvious abnormalities on inspection of external nose and ears  NECK: normal movements of the head and neck  LUNGS: on inspection no signs of respiratory distress, breathing rate appears normal, no obvious gross SOB, gasping or wheezing  CV: no obvious  cyanosis  MS: moves all visible extremities without noticeable abnormality  PSYCH/NEURO: pleasant and cooperative, no obvious depression or anxiety, speech and thought processing grossly intact  ASSESSMENT AND PLAN:  Discussed the following assessment and plan:  Depression with anxiety She been on sertraline for several years and reports that this is working pretty well for her.  Has been off for about a week and has noticed the effects of this.  Will restart at previous dose of 50 mg daily.    I discussed the assessment and treatment plan with the patient. The patient was provided an opportunity to ask questions and all were answered. The patient agreed with the plan and demonstrated an understanding of the instructions.   The patient was advised to call back or seek an in-person evaluation if the symptoms worsen or if the condition fails to improve as anticipated.    Everrett Coombe, DO

## 2023-06-18 ENCOUNTER — Encounter: Payer: Self-pay | Admitting: Family Medicine

## 2023-06-22 ENCOUNTER — Encounter: Payer: Self-pay | Admitting: Family Medicine

## 2023-07-14 ENCOUNTER — Ambulatory Visit
Admission: EM | Admit: 2023-07-14 | Discharge: 2023-07-14 | Disposition: A | Discharge status: Home / Self Care | Payer: BC Managed Care – PPO | Attending: Family Medicine | Admitting: Family Medicine

## 2023-07-14 ENCOUNTER — Other Ambulatory Visit: Payer: Self-pay

## 2023-07-14 ENCOUNTER — Ambulatory Visit: Payer: BC Managed Care – PPO

## 2023-07-14 DIAGNOSIS — M533 Sacrococcygeal disorders, not elsewhere classified: Secondary | ICD-10-CM | POA: Diagnosis not present

## 2023-07-14 DIAGNOSIS — R102 Pelvic and perineal pain: Secondary | ICD-10-CM | POA: Diagnosis not present

## 2023-07-14 DIAGNOSIS — R109 Unspecified abdominal pain: Secondary | ICD-10-CM

## 2023-07-14 LAB — POCT URINALYSIS DIP (MANUAL ENTRY)
Bilirubin, UA: NEGATIVE
Blood, UA: NEGATIVE
Glucose, UA: NEGATIVE mg/dL
Ketones, POC UA: NEGATIVE mg/dL
Leukocytes, UA: NEGATIVE
Nitrite, UA: NEGATIVE
Protein Ur, POC: NEGATIVE mg/dL
Spec Grav, UA: 1.025 (ref 1.010–1.025)
Urobilinogen, UA: 0.2 U/dL
pH, UA: 7 (ref 5.0–8.0)

## 2023-07-14 NOTE — Discharge Instructions (Signed)
Take ibuprofen 800 mg with food, 3 x a day If worse at any time go to ER

## 2023-07-14 NOTE — ED Triage Notes (Signed)
X few days has had pelvic cramping, now has lower back pain with nausea. Has had urinary frequency, some pain with urination.

## 2023-07-14 NOTE — ED Provider Notes (Signed)
[facility]    CSN: [id number] Arrival date & time: 07/14/23  0809      History   Chief Complaint Chief Complaint  Patient presents with   Dysuria    HPI Shayle Donahoo is a 32 y.o. female.   HPI  Mrs. Hamblin is a 32 year old married woman who is here with pelvic pain that radiates to her left low back.  Also has nausea.  No fever or chills.  Had some urinary frequency and is concerned for UTI.  No vaginal discharge or bleeding.  LMP September 15 to 20.  She states last bowel movement was yesterday.  She has had a cholecystectomy but no other abdominal surgery.  States she is not pregnant, husband has had a vasectomy.   Past Medical History:  Diagnosis Date   Bicornuate uterus    Depression    Morbid obesity [facility])     Patient Active Problem List   Diagnosis Date Noted   Depression with anxiety 05/07/2023   Obesity (BMI 35.0-39.9 without comorbidity) 08/08/2022    Past Surgical History:  Procedure Laterality Date   CESAREAN SECTION N/A 12/20/2020   Procedure: CESAREAN SECTION;  Surgeon: Levie Heritage, DO;  Location: [facility];  Service: Obstetrics;  Laterality: N/A;   GALLBLADDER SURGERY     may 2009    OB History     Gravida  6   Para  5   Term  3   Preterm  1   AB  1   Living  3      SAB  1   IAB      Ectopic      Multiple  0   Live Births  3            Home Medications    Prior to Admission medications   Medication Sig Start Date End Date Taking? Authorizing Provider  sertraline (ZOLOFT) 50 MG tablet Take 1 tablet (50 mg total) by mouth daily. 05/07/23   Everrett Coombe, DO    Family History Family History  Problem Relation Age of Onset   Melanoma Maternal Grandmother    Depression Mother    Hypertension Father    Stroke Father    Lung cancer Paternal Grandfather    Skin cancer Paternal Grandmother     Social History Social History   Tobacco Use   Smoking status: Never   Smokeless tobacco: Never  Vaping Use    Vaping status: Never Used  Substance Use Topics   Alcohol use: Never   Drug use: Never     Allergies   Sulfa antibiotics   Review of Systems Review of Systems See HPI  Physical Exam Triage Vital Signs ED Triage Vitals  Encounter Vitals Group     BP 07/14/23 0818 119/84     Systolic BP Percentile --      Diastolic BP Percentile --      Pulse Rate 07/14/23 0818 81     Resp 07/14/23 0818 16     Temp 07/14/23 0818 98.5 F (36.9 C)     Temp src --      SpO2 07/14/23 0818 99 %     Weight --      Height --      Head Circumference --      Peak Flow --      Pain Score 07/14/23 0820 6     Pain Loc --      Pain Education --  Exclude from Growth Chart --    No data found.  Updated Vital Signs BP 119/84 (BP Location: Left Arm)   Pulse 81   Temp 98.5 F (36.9 C)   Resp 16   SpO2 99%      Physical Exam Vitals reviewed.  Constitutional:      General: She is not in acute distress.    Appearance: She is well-developed.  HENT:     Head: Normocephalic and atraumatic.  Eyes:     Conjunctiva/sclera: Conjunctivae normal.     Pupils: Pupils are equal, round, and reactive to light.  Cardiovascular:     Rate and Rhythm: Normal rate.  Pulmonary:     Effort: Pulmonary effort is normal. No respiratory distress.  Abdominal:     General: Abdomen is flat. Bowel sounds are normal. There is no distension.     Palpations: Abdomen is soft.     Tenderness: There is abdominal tenderness. There is no right CVA tenderness, left CVA tenderness, guarding or rebound.     Comments: Abdomen is diffusely tender.  Palpation of right abdomen causes left low back pain.  Palpation of central and left abdomen diffusely tender as well no rebound.  No mass or organomegaly.  Musculoskeletal:        General: Tenderness present. Normal range of motion.     Cervical back: Normal range of motion.     Comments: Back is straight and symmetric.  No tenderness in the lumbar column of muscles over the  bony structures.  She does have acute tenderness over the left SI joint.  Skin:    General: Skin is warm and dry.  Neurological:     General: No focal deficit present.     Mental Status: She is alert.   The patient is uncomfortable.  She is reactive to examination and localizes and pulls away with palpation of abdominal and back structures indicated.   UC Treatments / Results  Labs (all labs ordered are listed, but only abnormal results are displayed) Labs Reviewed  POCT URINALYSIS DIP (MANUAL ENTRY)  CERVICOVAGINAL ANCILLARY ONLY    EKG   Radiology No results found.  Procedures Procedures (including critical care time)  Medications Ordered in UC Medications - No data to display  Initial Impression / Assessment and Plan / UC Course  I have reviewed the triage vital signs and the nursing notes.  Pertinent labs & imaging results that were available during my care of the patient were reviewed by me and considered in my medical decision making (see chart for details).     Phone call to radiology indicates that the KUB is negative. Final Clinical Impressions(s) / UC Diagnoses   Final diagnoses:  Pelvic pain  Pain of left sacroiliac joint     Discharge Instructions      Take ibuprofen 800 mg with food, 3 x a day If worse at any time go to ER     ED Prescriptions   None    PDMP not reviewed this encounter.   Eustace Moore, MD 07/14/23 1020

## 2023-07-15 ENCOUNTER — Telehealth: Payer: Self-pay

## 2023-07-15 NOTE — Telephone Encounter (Signed)
Pt called to check on lab results. Informed results still pending. Advised to keep checking mychart and call if anything is pos so we can send in meds for her. Pt verbalized understanding.

## 2023-07-16 ENCOUNTER — Telehealth: Payer: Self-pay | Admitting: Emergency Medicine

## 2023-07-16 LAB — CERVICOVAGINAL ANCILLARY ONLY
Bacterial Vaginitis (gardnerella): POSITIVE — AB
Candida Glabrata: NEGATIVE
Candida Vaginitis: NEGATIVE
Chlamydia: NEGATIVE
Comment: NEGATIVE
Comment: NEGATIVE
Comment: NEGATIVE
Comment: NEGATIVE
Comment: NEGATIVE
Comment: NORMAL
Neisseria Gonorrhea: NEGATIVE
Trichomonas: NEGATIVE

## 2023-07-16 MED ORDER — METRONIDAZOLE 500 MG PO TABS: 500.0000 mg | ORAL_TABLET | Freq: Two times a day (BID) | ORAL | 0 refills | Status: DC

## 2023-07-16 NOTE — Telephone Encounter (Addendum)
Flagyl called in per positive BV test. Chart indicates pt is breast feeding however staff called and confirmed that patient is not breast feeding at current time. Pt to avoid ETOH while on the antibiotic.   ----- Message from [facility] Rachelle I sent at 07/16/2023  8:27 AM EDT ----- Pt tested pos for BV, calling for rx. Pharmacy: [facility] in [place]. Thank you.

## 2023-07-16 NOTE — Telephone Encounter (Signed)
Opened in error.  On chart review, prescription was already sent

## 2023-07-17 ENCOUNTER — Telehealth: Payer: Self-pay

## 2023-07-17 MED ORDER — METRONIDAZOLE 0.75 % VA GEL: 1.0000 | Freq: Two times a day (BID) | VAGINAL | 0 refills | Status: DC

## 2023-07-17 NOTE — Telephone Encounter (Signed)
Patient feels she is not tolerating the metronidazole oral for her BV.  She was told to stop this medication.  I am calling in metronidazole vaginal gel.  I feel this will be tolerated.

## 2023-07-17 NOTE — Telephone Encounter (Signed)
Received message from patient, believes she is having a reaction to metranidazole; has rapid breathing and feeling agitated. Discussed with Dr. Delton See who suggests stopping medication, will send in cream, and if her mental state does not improve then to present to ER. Notified patient of this. Patient verbalizes understanding.

## 2023-11-11 ENCOUNTER — Other Ambulatory Visit: Payer: Self-pay | Admitting: Medical-Surgical

## 2023-11-11 MED ORDER — OSELTAMIVIR PHOSPHATE 75 MG PO CAPS: 75.0000 mg | ORAL_CAPSULE | Freq: Every day | ORAL | 0 refills | Status: DC

## 2024-05-19 ENCOUNTER — Other Ambulatory Visit: Payer: Self-pay | Admitting: Family Medicine

## 2024-05-19 DIAGNOSIS — F419 Anxiety disorder, unspecified: Secondary | ICD-10-CM

## 2024-05-19 NOTE — Telephone Encounter (Signed)
 Pls contact pt to schedule appt with Dr. Alvia for medication refill. Sending 30 day med refill. Thx.

## 2024-05-20 ENCOUNTER — Telehealth: Payer: Self-pay

## 2024-05-20 NOTE — Telephone Encounter (Signed)
 Copied from CRM #[id number]. Topic: General - Other >> May 20, 2024  8:54 AM Miquel SAILOR wrote: Reason for CRM: sertraline  (ZOLOFT ) 50 MG tablet Patient returning call from office Called and  Tranfers to office for app on her medication

## 2024-05-20 NOTE — Telephone Encounter (Signed)
 Patient scheduled for 06/11/2024 for zoloft  follow up and refill

## 2024-06-11 ENCOUNTER — Ambulatory Visit: Admitting: Family Medicine

## 2024-06-11 ENCOUNTER — Encounter: Payer: Self-pay | Admitting: Family Medicine

## 2024-06-11 VITALS — BP 112/76 | HR 82 | Ht 61.0 in | Wt 214.0 lb

## 2024-06-11 DIAGNOSIS — F418 Other specified anxiety disorders: Secondary | ICD-10-CM

## 2024-06-11 DIAGNOSIS — E669 Obesity, unspecified: Secondary | ICD-10-CM | POA: Diagnosis not present

## 2024-06-11 DIAGNOSIS — F419 Anxiety disorder, unspecified: Secondary | ICD-10-CM

## 2024-06-11 MED ORDER — ZEPBOUND 2.5 MG/0.5ML ~~LOC~~ SOAJ
2.5000 mg | SUBCUTANEOUS | 0 refills | Status: DC
Start: 2024-06-11 — End: 2024-08-07

## 2024-06-11 MED ORDER — ZEPBOUND 5 MG/0.5ML ~~LOC~~ SOAJ: 5.0000 mg | SUBCUTANEOUS | 0 refills | Status: DC

## 2024-06-11 MED ORDER — SERTRALINE HCL 50 MG PO TABS
50.0000 mg | ORAL_TABLET | Freq: Every day | ORAL | 0 refills | Status: DC
Start: 2024-06-11 — End: 2024-07-16

## 2024-06-11 NOTE — Progress Notes (Signed)
 Allison Oconnell - 33 y.o. female MRN [id number]  Date of birth: [birth year]  Subjective Chief Complaint  Patient presents with   Mood    HPI Allison Oconnell is a 33 y.o. female here today for follow-up visit.  She reports he is doing pretty well at this time.  Feels pretty good with current strength of sertraline .  Would like to continue on this for now.  She denies any significant side effects.  Additionally she like to discuss options for weight management.  She did try phentermine  in the past and did pretty well with this but the last time she tried this she had side effects of tachycardia.  She is exercising pretty regularly and feels like diet is pretty good.  Despite this she has been unable to lose a significant amount of weight.  She is interested in trying GLP-1 medication.  ROS:  A comprehensive ROS was completed and negative except as noted per HPI    Past Medical History:  Diagnosis Date   Bicornuate uterus    Depression    Morbid obesity (HCC)     Past Surgical History:  Procedure Laterality Date   CESAREAN SECTION N/A 12/20/2020   Procedure: CESAREAN SECTION;  Surgeon: Barbra Lang PARAS, DO;  Location: [facility];  Service: Obstetrics;  Laterality: N/A;   GALLBLADDER SURGERY     may 2009    Social History   Socioeconomic History   Marital status: Married    Spouse name: Not on file   Number of children: Not on file   Years of education: Not on file   Highest education level: Not on file  Occupational History   Occupation: homemaker  Tobacco Use   Smoking status: Never   Smokeless tobacco: Never  Vaping Use   Vaping status: Never Used  Substance and Sexual Activity   Alcohol use: Never   Drug use: Never   Sexual activity: Yes    Birth control/protection: None, Other-see comments    Comment: Vasectomy  Other Topics Concern   Not on file  Social History Narrative   Not on file   Social Drivers of Health   Financial Resource Strain: Not on file  Food  Insecurity: Not on file  Transportation Needs: Not on file  Physical Activity: Not on file  Stress: Not on file  Social Connections: Not on file    Family History  Problem Relation Age of Onset   Melanoma Maternal Grandmother    Depression Mother    Hypertension Father    Stroke Father    Lung cancer Paternal Grandfather    Skin cancer Paternal Grandmother     Health Maintenance  Topic Date Due   Hepatitis B Vaccines 19-59 Average Risk (1 of 3 - 19+ 3-dose series) Never done   HPV VACCINES (1 - 3-dose SCDM series) Never done   COVID-19 Vaccine (1 - 2024-25 season) Never done   INFLUENZA VACCINE  05/15/2024   Cervical Cancer Screening (HPV/Pap Cotest)  01/16/2027   DTaP/Tdap/Td (2 - Td or Tdap) 10/04/2030   Hepatitis C Screening  Completed   HIV Screening  Completed   Pneumococcal Vaccine  Aged Out   Meningococcal B Vaccine  Aged Out     ----------------------------------------------------------------------------------------------------------------------------------------------------------------------------------------------------------------- Physical Exam BP 112/76 (BP Location: Left Arm, Patient Position: Sitting, Cuff Size: Large)   Pulse 82   Ht 5' 1 (1.549 m)   Wt 214 lb (97.1 kg)   SpO2 100%   BMI 40.43 kg/m   Physical Exam  Constitutional:      Appearance: Normal appearance.  Eyes:     General: No scleral icterus. Cardiovascular:     Rate and Rhythm: Normal rate and regular rhythm.  Pulmonary:     Effort: Pulmonary effort is normal.     Breath sounds: Normal breath sounds.  Neurological:     General: No focal deficit present.     Mental Status: She is alert.  Psychiatric:        Mood and Affect: Mood normal.        Behavior: Behavior normal.      ------------------------------------------------------------------------------------------------------------------------------------------------------------------------------------------------------------------- Assessment and Plan  Depression with anxiety She been on sertraline  for several years and reports that this is working pretty well for her.  Will plan to continue at current strength.  Obesity (BMI 35.0-39.9 without comorbidity) Discussed pros and cons of GLP-1 medication.  I do think she would benefit from weight loss.  She has been following a home exercise plan as well as working on dietary change.  She will continue following a physician directed diet and exercise plan.  She has no contraindication to Zepbound .   Meds ordered this encounter  Medications   sertraline  (ZOLOFT ) 50 MG tablet    Sig: Take 1 tablet (50 mg total) by mouth daily.    Dispense:  30 tablet    Refill:  0   tirzepatide  (ZEPBOUND ) 5 MG/0.5ML Pen    Sig: Inject 5 mg into the skin once a week.    Dispense:  2 mL    Refill:  0   tirzepatide  (ZEPBOUND ) 2.5 MG/0.5ML Pen    Sig: Inject 2.5 mg into the skin once a week.    Dispense:  2 mL    Refill:  0    No follow-ups on file.

## 2024-06-11 NOTE — Assessment & Plan Note (Addendum)
 Discussed pros and cons of GLP-1 medication.  I do think she would benefit from weight loss.  She has been following a home exercise plan as well as working on dietary change.  She will continue following a physician directed diet and exercise plan.  She has no contraindication to Zepbound .

## 2024-06-11 NOTE — Assessment & Plan Note (Signed)
 She been on sertraline  for several years and reports that this is working pretty well for her.  Will plan to continue at current strength.

## 2024-06-12 ENCOUNTER — Other Ambulatory Visit (HOSPITAL_COMMUNITY): Payer: Self-pay

## 2024-06-16 ENCOUNTER — Telehealth: Payer: Self-pay

## 2024-06-16 ENCOUNTER — Other Ambulatory Visit (HOSPITAL_COMMUNITY): Payer: Self-pay

## 2024-06-16 NOTE — Telephone Encounter (Signed)
 Pharmacy Patient Advocate Encounter  Received notification from [facility] that Prior Authorization for Wegovy 0.25mg /0.64ml has been APPROVED from 05/13/24 to 02/08/25   PA #/Case ID/Reference #: [id number]

## 2024-07-16 ENCOUNTER — Other Ambulatory Visit: Payer: Self-pay | Admitting: Family Medicine

## 2024-07-16 DIAGNOSIS — F419 Anxiety disorder, unspecified: Secondary | ICD-10-CM

## 2024-08-07 ENCOUNTER — Encounter: Payer: Self-pay | Admitting: Family Medicine

## 2024-08-07 ENCOUNTER — Ambulatory Visit: Admitting: Family Medicine

## 2024-08-07 VITALS — BP 91/61 | HR 87 | Ht 61.0 in | Wt 204.0 lb

## 2024-08-07 DIAGNOSIS — E669 Obesity, unspecified: Secondary | ICD-10-CM

## 2024-08-07 MED ORDER — ZEPBOUND 5 MG/0.5ML ~~LOC~~ SOAJ: 5.0000 mg | SUBCUTANEOUS | 3 refills | Status: DC

## 2024-08-07 NOTE — Assessment & Plan Note (Signed)
 Doing well with zepbound  at current strength.  Will plan to continue at current strength.

## 2024-08-07 NOTE — Progress Notes (Signed)
 Allison Oconnell - 33 y.o. female MRN [id number]  Date of birth: 12-08-1990  Subjective Chief Complaint  Patient presents with   Follow-up    WEIGHT ON ZEPBOUND     HPI Allison Oconnell is a 33 y.o. female here today for f/u of weight management.   She is currently on 5mg  of zepbound .  Overall doing pretty well with this.  She is being more intentional about his food choices.  Weight is down about 10lbs since last visit.  Some side effects initially but these seem to have faded.   ROS:  A comprehensive ROS was completed and negative except as noted per HPI  Allergies  Allergen Reactions   Sulfa Antibiotics Rash    Past Medical History:  Diagnosis Date   Bicornuate uterus    Depression    Morbid obesity (HCC)     Past Surgical History:  Procedure Laterality Date   CESAREAN SECTION N/A 12/20/2020   Procedure: CESAREAN SECTION;  Surgeon: Barbra Lang PARAS, DO;  Location: [facility];  Service: Obstetrics;  Laterality: N/A;   GALLBLADDER SURGERY     may 2009    Social History   Socioeconomic History   Marital status: Married    Spouse name: Not on file   Number of children: Not on file   Years of education: Not on file   Highest education level: Not on file  Occupational History   Occupation: homemaker  Tobacco Use   Smoking status: Never   Smokeless tobacco: Never  Vaping Use   Vaping status: Never Used  Substance and Sexual Activity   Alcohol use: Never   Drug use: Never   Sexual activity: Yes    Birth control/protection: None, Other-see comments    Comment: Vasectomy  Other Topics Concern   Not on file  Social History Narrative   Not on file   Social Drivers of Health   Financial Resource Strain: Not on file  Food Insecurity: Not on file  Transportation Needs: Not on file  Physical Activity: Not on file  Stress: Not on file  Social Connections: Not on file    Family History  Problem Relation Age of Onset   Melanoma Maternal Grandmother    Depression Mother     Hypertension Father    Stroke Father    Lung cancer Paternal Grandfather    Skin cancer Paternal Grandmother     Health Maintenance  Topic Date Due   Hepatitis B Vaccines 19-59 Average Risk (1 of 3 - 19+ 3-dose series) Never done   HPV VACCINES (1 - 3-dose SCDM series) Never done   COVID-19 Vaccine (1 - 2025-26 season) 08/23/2024 (Originally 06/15/2024)   Influenza Vaccine  01/12/2025 (Originally 05/15/2024)   Cervical Cancer Screening (HPV/Pap Cotest)  01/16/2027   DTaP/Tdap/Td (2 - Td or Tdap) 10/04/2030   Hepatitis C Screening  Completed   HIV Screening  Completed   Pneumococcal Vaccine  Aged Out   Meningococcal B Vaccine  Aged Out     ----------------------------------------------------------------------------------------------------------------------------------------------------------------------------------------------------------------- Physical Exam BP 91/61 (BP Location: Right Arm, Patient Position: Sitting, Cuff Size: Normal)   Pulse 87   Ht 5' 1 (1.549 m)   Wt 204 lb (92.5 kg)   SpO2 98%   BMI 38.55 kg/m   Physical Exam Constitutional:      Appearance: Normal appearance.  Neurological:     Mental Status: She is alert.  Psychiatric:        Mood and Affect: Mood normal.  Behavior: Behavior normal.     ------------------------------------------------------------------------------------------------------------------------------------------------------------------------------------------------------------------- Assessment and Plan  Obesity (BMI 35.0-39.9 without comorbidity) Doing well with zepbound  at current strength.  Will plan to continue at current strength.    Meds ordered this encounter  Medications   tirzepatide  (ZEPBOUND ) 5 MG/0.5ML Pen    Sig: Inject 5 mg into the skin once a week.    Dispense:  2 mL    Refill:  3    Return in about 3 months (around 11/07/2024) for weight management.

## 2024-09-01 ENCOUNTER — Encounter: Payer: Self-pay | Admitting: Family Medicine

## 2024-09-01 MED ORDER — TIRZEPATIDE 10 MG/0.5ML ~~LOC~~ SOAJ: 10.0000 mg | SUBCUTANEOUS | 0 refills | Status: DC

## 2024-09-01 NOTE — Telephone Encounter (Signed)
 Patient requesting rx rf of Zepbound  with strength increase  Last written as 5mg  on 08/07/2024 Last OV 08/07/2024 Upcoming app = none  Pended prescription with strength increase for completion.

## 2024-09-07 NOTE — Telephone Encounter (Signed)
 Copied from CRM [id number]. Topic: Clinical - Medication Question >> Sep 07, 2024  3:18 PM Sophia H wrote: Reason for CRM: Patient is following up on her request to up her medication - states she sent a message on 11/18 about this. Advised waiting on approval from PCP - please advise # [phone] Needing to be sent before the weekend to stay on track with meds.    **tirzepatide  (ZEPBOUND ) 5 MG/0.5ML Pen  [facility] - [place], [place] - [address]

## 2024-09-08 ENCOUNTER — Other Ambulatory Visit (HOSPITAL_COMMUNITY): Payer: Self-pay

## 2024-09-08 MED ORDER — ZEPBOUND 7.5 MG/0.5ML ~~LOC~~ SOAJ: 7.5000 mg | SUBCUTANEOUS | 1 refills | Status: DC

## 2024-09-08 NOTE — Addendum Note (Signed)
 Addended by: ALVIA VELMA BRAVO on: 09/08/2024 09:52 AM   Modules accepted: Orders

## 2024-10-16 ENCOUNTER — Encounter: Payer: Self-pay | Admitting: Physician Assistant

## 2024-10-16 ENCOUNTER — Ambulatory Visit: Admitting: Physician Assistant

## 2024-10-16 VITALS — BP 106/61 | HR 88 | Temp 98.3°F | Wt 192.0 lb

## 2024-10-16 DIAGNOSIS — J0140 Acute pansinusitis, unspecified: Secondary | ICD-10-CM

## 2024-10-16 DIAGNOSIS — Z20828 Contact with and (suspected) exposure to other viral communicable diseases: Secondary | ICD-10-CM

## 2024-10-16 DIAGNOSIS — R6889 Other general symptoms and signs: Secondary | ICD-10-CM | POA: Diagnosis not present

## 2024-10-16 DIAGNOSIS — J029 Acute pharyngitis, unspecified: Secondary | ICD-10-CM | POA: Diagnosis not present

## 2024-10-16 LAB — POCT RAPID STREP A (OFFICE): Rapid Strep A Screen: NEGATIVE

## 2024-10-16 LAB — POC SOFIA 2 FLU + SARS ANTIGEN FIA
Influenza A, POC: NEGATIVE
Influenza B, POC: NEGATIVE
SARS Coronavirus 2 Ag: NEGATIVE

## 2024-10-16 MED ORDER — AMOXICILLIN-POT CLAVULANATE 875-125 MG PO TABS: 1.0000 | ORAL_TABLET | Freq: Two times a day (BID) | ORAL | 0 refills | Status: DC

## 2024-10-16 NOTE — Patient Instructions (Signed)

## 2024-10-16 NOTE — Progress Notes (Signed)
 "  Acute Office Visit  Subjective:     Patient ID: Allison Oconnell, female    DOB: 05-Nov-1990, 34 y.o.   MRN: [id number]  Chief Complaint  Patient presents with   Cough    HPI .SABRADiscussed the use of AI scribe software for clinical note transcription with the patient, who gave verbal consent to proceed.  History of Present Illness Allison Oconnell is a 34 year old female who presents with flu-like symptoms since Christmas.  Upper respiratory symptoms - Severe sore throat began on Christmas night, described as 'my throat was on fire' - Persistent nasal congestion, worse at night - Morning rhinorrhea with clear nasal discharge - Facial pressure, described as 'someone's hitting my face' - No significant body aches - No current fever  Symptom trajectory and exposures - Symptoms have persisted for approximately one week and appear to be worsening - Positive exposures to both influenza A and influenza B - Did not receive COVID-19 or influenza vaccine this year  Medication use and response - Using Nyquil and Dayquil, which have provided some relief - Symptoms persist despite medication use  Adverse medication reaction history - Visited urgent care in October for an infection - Experienced panic attacks and anxiety as adverse reactions to medication prescribed at that time    ROS See HPI.      Objective:    BP 106/61 (Cuff Size: Normal)   Pulse 88   Temp 98.3 F (36.8 C) (Oral)   Wt 192 lb (87.1 kg)   SpO2 99%   BMI 36.28 kg/m  BP Readings from Last 3 Encounters:  10/16/24 106/61  08/07/24 91/61  06/11/24 112/76   Wt Readings from Last 3 Encounters:  10/16/24 192 lb (87.1 kg)  08/07/24 204 lb (92.5 kg)  06/11/24 214 lb (97.1 kg)      Physical Exam Constitutional:      Appearance: Normal appearance.  HENT:     Head: Normocephalic.     Comments: Tenderness to palpation over maxillary and frontal sinuses to palpation.     Right Ear: Tympanic membrane, ear canal  and external ear normal. There is no impacted cerumen.     Left Ear: Tympanic membrane, ear canal and external ear normal. There is no impacted cerumen.     Nose: Congestion present.     Mouth/Throat:     Mouth: Mucous membranes are moist.     Pharynx: Posterior oropharyngeal erythema present. No oropharyngeal exudate.  Eyes:     Conjunctiva/sclera: Conjunctivae normal.  Cardiovascular:     Rate and Rhythm: Normal rate and regular rhythm.  Pulmonary:     Effort: Pulmonary effort is normal.     Breath sounds: Normal breath sounds.  Musculoskeletal:     Cervical back: Normal range of motion and neck supple. Tenderness present.  Lymphadenopathy:     Cervical: Cervical adenopathy present.  Neurological:     General: No focal deficit present.     Mental Status: She is alert and oriented to person, place, and time.  Psychiatric:        Mood and Affect: Mood normal.     Results for orders placed or performed in visit on 10/16/24  POCT rapid strep A  Result Value Ref Range   Rapid Strep A Screen Negative Negative  POC SOFIA 2 FLU + SARS ANTIGEN FIA  Result Value Ref Range   Influenza A, POC Negative Negative   Influenza B, POC Negative Negative   SARS Coronavirus 2 Ag Negative Negative  Assessment & Plan:  .Allison Oconnell was seen today for cough.  Diagnoses and all orders for this visit:  Acute non-recurrent pansinusitis -     amoxicillin-clavulanate (AUGMENTIN) 875-125 MG tablet; Take 1 tablet by mouth 2 (two) times daily.  Flu-like symptoms -     POCT rapid strep A -     POC SOFIA 2 FLU + SARS ANTIGEN FIA  Sore throat -     POCT rapid strep A -     POC SOFIA 2 FLU + SARS ANTIGEN FIA  Exposure to the flu -     POCT rapid strep A -     POC SOFIA 2 FLU + SARS ANTIGEN FIA   Assessment & Plan Acute pansinusitis with flu like symptoms Suspected secondary bacterial sinus infection following viral infection. Flu/Covid/Strep - Prescribed Augmentin for 10 days for sinus  infection.  - Advised ibuprofen  or acetaminophen  for symptom relief.   Telford Archambeau, PA-C   "

## 2024-11-02 ENCOUNTER — Other Ambulatory Visit: Payer: Self-pay | Admitting: Family Medicine

## 2024-11-04 ENCOUNTER — Telehealth: Payer: Self-pay

## 2024-11-04 MED ORDER — ZEPBOUND 10 MG/0.5ML ~~LOC~~ SOAJ: 10.0000 mg | SUBCUTANEOUS | 2 refills | Status: DC

## 2024-11-04 NOTE — Telephone Encounter (Signed)
 Spoke with patient  It looks like Mounjaro  was sent in instead of Zepbound   She would like to do the strength at 10mg  for the zepbound  .  Pended Zepbound  - if this is appropriate please let me know so that I can contact the patient ( requesting a call by phone ) to let her know this has been done. Thank you.

## 2024-11-04 NOTE — Telephone Encounter (Signed)
 Copied from CRM #[id number]. Topic: Clinical - Prescription Issue >> Nov 04, 2024 12:15 PM Allison Oconnell wrote: Pt was taking Zepbound  didn't know if it changed. Says the pharmacy told her to contact her pcp It says Tirzepatide -Weight Management 7.5 MG/0.5ML- refused >> Nov 04, 2024 12:17 PM Allison Oconnell wrote: Prefer phone call

## 2024-11-05 NOTE — Telephone Encounter (Signed)
 Attempted call to patient. Left a detailed voicemail message requesting a return call if still having difficulty in filling medication at pharmacy or if she has questions.SABRA

## 2024-11-10 ENCOUNTER — Other Ambulatory Visit (HOSPITAL_COMMUNITY): Payer: Self-pay

## 2024-11-10 ENCOUNTER — Telehealth: Payer: Self-pay

## 2024-11-10 NOTE — Telephone Encounter (Signed)
 Copied from CRM #[id number]. Topic: Clinical - Medical Advice >> Nov 09, 2024  2:45 PM Darshell M wrote: Reason for CRM: Patient for Zepbound  injection today but still having a problem getting medication approved. Patient wondering what she needs to do moving forward. Should she stay with the 7.5 mg she was previously taking or should she continue with the 10mg  once its filled as planned since she is missing the dose today. Patient CB# [phone]

## 2024-11-10 NOTE — Telephone Encounter (Signed)
 Pharmacy Patient Advocate Encounter   Received notification from Physician's Office that prior authorization for ZEPBOUND is required/requested.   Insurance verification completed.     Per test claim: Per test claim, medication is not covered due to plan/benefit exclusion, PA not submitted at this time

## 2024-11-10 NOTE — Telephone Encounter (Signed)
 Does the Zepbound  need a PA?

## 2024-11-11 ENCOUNTER — Encounter: Payer: Self-pay | Admitting: Family Medicine

## 2024-11-11 ENCOUNTER — Telehealth (INDEPENDENT_AMBULATORY_CARE_PROVIDER_SITE_OTHER): Admitting: Family Medicine

## 2024-11-11 ENCOUNTER — Telehealth: Payer: Self-pay

## 2024-11-11 VITALS — Ht 61.0 in | Wt 187.0 lb

## 2024-11-11 DIAGNOSIS — E669 Obesity, unspecified: Secondary | ICD-10-CM | POA: Diagnosis not present

## 2024-11-11 MED ORDER — WEGOVY 0.5 MG/0.5ML ~~LOC~~ SOAJ: 0.5000 mg | SUBCUTANEOUS | 0 refills | Status: DC

## 2024-11-11 MED ORDER — WEGOVY 1 MG/0.5ML ~~LOC~~ SOAJ: 1.0000 mg | SUBCUTANEOUS | 1 refills | Status: DC

## 2024-11-11 NOTE — Telephone Encounter (Signed)
 Message sent to patient through My Chart

## 2024-11-11 NOTE — Telephone Encounter (Signed)
 Copied from CRM #[id number]. Topic: Clinical - Medication Question >> Nov 11, 2024  3:24 PM Larissa RAMAN wrote: Reason for CRM: Patient states injection form for Wegovy  cost around $1700 and was advised via pharmacist to consider pill form instead to see if insurance will cover a portion of the cost. Patient requesting a callback from nurse/PCP?

## 2024-11-11 NOTE — Progress Notes (Signed)
 " Allison Oconnell - 34 y.o. female MRN [id number]  Date of birth: 04/17/1991   All issues noted in this document were discussed and addressed.  No physical exam was performed (except for noted visual exam findings with Video Visits).  I discussed the limitations of evaluation and management by telemedicine and the availability of in person appointments. The patient expressed understanding and agreed to proceed.  I connected withNAME@ on 11/11/24 at  2:10 PM EST by a video enabled telemedicine application and verified that I am speaking with the correct person using two identifiers.  Present at visit: Velma Ku, DO Kerman Novak   Patient Location: Home [address] [place] [address]   Provider location:   [facility]  Chief Complaint  Patient presents with   Weight Loss    HPI  Allison Oconnell is a 34 y.o. female who presents via audio/video conferencing for a telehealth visit today.  She is following up on weight loss.  She has been on Zepbound  for weight management.  She has done quite well with this however her insurance has stopped providing coverage for this.  She is unsure if other medications are covered.  She has been on phentermine  in the past but had weight gain after stopping this.    ROS:  A comprehensive ROS was completed and negative except as noted per HPI  Past Medical History:  Diagnosis Date   Bicornuate uterus    Depression    Morbid obesity (HCC)     Past Surgical History:  Procedure Laterality Date   CESAREAN SECTION N/A 12/20/2020   Procedure: CESAREAN SECTION;  Surgeon: Barbra Lang PARAS, DO;  Location: [facility];  Service: Obstetrics;  Laterality: N/A;   GALLBLADDER SURGERY     may 2009    Family History  Problem Relation Age of Onset   Melanoma Maternal Grandmother    Depression Mother    Hypertension Father    Stroke Father    Lung cancer Paternal Grandfather    Skin cancer Paternal Grandmother     Social History   Socioeconomic History    Marital status: Married    Spouse name: Not on file   Number of children: Not on file   Years of education: Not on file   Highest education level: Not on file  Occupational History   Occupation: homemaker  Tobacco Use   Smoking status: Never   Smokeless tobacco: Never  Vaping Use   Vaping status: Never Used  Substance and Sexual Activity   Alcohol use: Never   Drug use: Never   Sexual activity: Yes    Birth control/protection: None, Other-see comments    Comment: Vasectomy  Other Topics Concern   Not on file  Social History Narrative   Not on file   Social Drivers of Health   Tobacco Use: Low Risk (11/11/2024)   Patient History    Smoking Tobacco Use: Never    Smokeless Tobacco Use: Never    Passive Exposure: Not on file  Financial Resource Strain: Not on file  Food Insecurity: No Food Insecurity (11/11/2024)   Epic    Worried About Programme Researcher, Broadcasting/film/video in the Last Year: Never true    Ran Out of Food in the Last Year: Never true  Transportation Needs: No Transportation Needs (11/11/2024)   Epic    Lack of Transportation (Medical): No    Lack of Transportation (Non-Medical): No  Physical Activity: Not on file  Stress: Not on file  Social  Connections: Not on file  Intimate Partner Violence: Not At Risk (11/11/2024)   Epic    Fear of Current or Ex-Partner: No    Emotionally Abused: No    Physically Abused: No    Sexually Abused: No  Depression (PHQ2-9): Low Risk (11/11/2024)   Depression (PHQ2-9)    PHQ-2 Score: 1  Alcohol Screen: Not on file  Housing: Low Risk (11/11/2024)   Epic    Unable to Pay for Housing in the Last Year: No    Number of Times Moved in the Last Year: 0    Homeless in the Last Year: No  Utilities: Not At Risk (11/11/2024)   Epic    Threatened with loss of utilities: No  Health Literacy: Not on file    Current Medications[1]  EXAM:  VITALS per patient if applicable: Ht 5' 1 (1.549 m)   Wt 187 lb (84.8 kg)   BMI 35.33 kg/m   GENERAL:  alert, oriented, appears well and in no acute distress  HEENT: atraumatic, conjunttiva clear, no obvious abnormalities on inspection of external nose and ears  NECK: normal movements of the head and neck  LUNGS: on inspection no signs of respiratory distress, breathing rate appears normal, no obvious gross SOB, gasping or wheezing  CV: no obvious cyanosis  MS: moves all visible extremities without noticeable abnormality  PSYCH/NEURO: pleasant and cooperative, no obvious depression or anxiety, speech and thought processing grossly intact  ASSESSMENT AND PLAN:  Discussed the following assessment and plan:  Obesity (BMI 35.0-39.9 without comorbidity) She has done really well with Zepbound  however her insurance stopped covering this.  We discussed alternatives and we'll see if Wegovy  is covered.        I discussed the assessment and treatment plan with the patient. The patient was provided an opportunity to ask questions and all were answered. The patient agreed with the plan and demonstrated an understanding of the instructions.   The patient was advised to call back or seek an in-person evaluation if the symptoms worsen or if the condition fails to improve as anticipated.    Velma Ku, DO      [1]  Current Outpatient Medications:    semaglutide -weight management (WEGOVY ) 0.5 MG/0.5ML SOAJ SQ injection, Inject 0.5 mg into the skin once a week. Use x4 weeks then increase to 1mg , Disp: 2 mL, Rfl: 0   semaglutide -weight management (WEGOVY ) 1 MG/0.5ML SOAJ SQ injection, Inject 1 mg into the skin once a week., Disp: 2 mL, Rfl: 1   sertraline  (ZOLOFT ) 50 MG tablet, Take 1 tablet by mouth once daily, Disp: 90 tablet, Rfl: 1  "

## 2024-11-11 NOTE — Assessment & Plan Note (Signed)
 She has done really well with Zepbound  however her insurance stopped covering this.  We discussed alternatives and we'll see if Wegovy  is covered.

## 2024-11-13 MED ORDER — WEGOVY 4 MG PO TABS: 4.0000 mg | ORAL_TABLET | Freq: Every day | ORAL | 0 refills | Status: AC

## 2024-11-13 MED ORDER — WEGOVY 9 MG PO TABS: 9.0000 mg | ORAL_TABLET | Freq: Every day | ORAL | 0 refills | Status: AC

## 2024-11-13 MED ORDER — WEGOVY 1.5 MG PO TABS: 1.5000 mg | ORAL_TABLET | Freq: Every day | ORAL | 0 refills | Status: AC

## 2024-11-13 NOTE — Telephone Encounter (Signed)
 Patient advised
